# Patient Record
Sex: Male | Born: 1960 | Race: White | Hispanic: No | Smoking: Current every day smoker
Health system: Southern US, Community
[De-identification: ages and names within clinical notes are randomized; demographics above are authoritative.]

## PROBLEM LIST (undated history)

## (undated) DIAGNOSIS — F419 Anxiety disorder, unspecified: Secondary | ICD-10-CM

## (undated) DIAGNOSIS — G8929 Other chronic pain: Secondary | ICD-10-CM

## (undated) DIAGNOSIS — Z8781 Personal history of (healed) traumatic fracture: Secondary | ICD-10-CM

## (undated) DIAGNOSIS — E785 Hyperlipidemia, unspecified: Secondary | ICD-10-CM

## (undated) DIAGNOSIS — M1990 Unspecified osteoarthritis, unspecified site: Secondary | ICD-10-CM

## (undated) DIAGNOSIS — I10 Essential (primary) hypertension: Secondary | ICD-10-CM

## (undated) DIAGNOSIS — M549 Dorsalgia, unspecified: Secondary | ICD-10-CM

## (undated) HISTORY — DX: Dorsalgia, unspecified: M54.9

## (undated) HISTORY — DX: Unspecified osteoarthritis, unspecified site: M19.90

## (undated) HISTORY — PX: TONSILLECTOMY: SUR1361

## (undated) HISTORY — DX: Personal history of (healed) traumatic fracture: Z87.81

## (undated) HISTORY — DX: Hyperlipidemia, unspecified: E78.5

## (undated) HISTORY — DX: Anxiety disorder, unspecified: F41.9

## (undated) HISTORY — DX: Essential (primary) hypertension: I10

## (undated) HISTORY — DX: Other chronic pain: G89.29

---

## 2009-10-12 DIAGNOSIS — G8929 Other chronic pain: Secondary | ICD-10-CM

## 2009-10-12 HISTORY — DX: Other chronic pain: G89.29

## 2012-04-20 ENCOUNTER — Telehealth: Payer: Self-pay

## 2012-04-20 NOTE — Telephone Encounter (Signed)
LMOM to call.

## 2012-04-22 NOTE — Telephone Encounter (Signed)
LMOM to call.

## 2012-04-22 NOTE — Telephone Encounter (Signed)
Called, busy

## 2012-04-26 NOTE — Telephone Encounter (Signed)
Letter sent to pt and to pcp.

## 2016-05-12 DIAGNOSIS — Z139 Encounter for screening, unspecified: Secondary | ICD-10-CM

## 2016-05-14 ENCOUNTER — Ambulatory Visit: Payer: Self-pay | Admitting: Physician Assistant

## 2016-05-14 ENCOUNTER — Other Ambulatory Visit: Payer: Self-pay | Admitting: Physician Assistant

## 2016-05-14 ENCOUNTER — Encounter: Payer: Self-pay | Admitting: Physician Assistant

## 2016-05-14 VITALS — BP 140/82 | HR 104 | Temp 98.4°F | Ht 69.0 in | Wt 296.4 lb

## 2016-05-14 DIAGNOSIS — Z1211 Encounter for screening for malignant neoplasm of colon: Secondary | ICD-10-CM

## 2016-05-14 DIAGNOSIS — F17210 Nicotine dependence, cigarettes, uncomplicated: Secondary | ICD-10-CM

## 2016-05-14 DIAGNOSIS — R000 Tachycardia, unspecified: Secondary | ICD-10-CM

## 2016-05-14 DIAGNOSIS — I10 Essential (primary) hypertension: Secondary | ICD-10-CM

## 2016-05-14 DIAGNOSIS — F419 Anxiety disorder, unspecified: Secondary | ICD-10-CM

## 2016-05-14 DIAGNOSIS — G8929 Other chronic pain: Secondary | ICD-10-CM

## 2016-05-14 DIAGNOSIS — R9431 Abnormal electrocardiogram [ECG] [EKG]: Secondary | ICD-10-CM

## 2016-05-14 DIAGNOSIS — Z131 Encounter for screening for diabetes mellitus: Secondary | ICD-10-CM

## 2016-05-14 DIAGNOSIS — R609 Edema, unspecified: Secondary | ICD-10-CM

## 2016-05-14 DIAGNOSIS — E785 Hyperlipidemia, unspecified: Secondary | ICD-10-CM

## 2016-05-14 DIAGNOSIS — M549 Dorsalgia, unspecified: Secondary | ICD-10-CM

## 2016-05-14 DIAGNOSIS — Z125 Encounter for screening for malignant neoplasm of prostate: Secondary | ICD-10-CM

## 2016-05-14 LAB — GLUCOSE, POCT (MANUAL RESULT ENTRY): POC GLUCOSE: 98 mg/dL (ref 70–99)

## 2016-05-14 NOTE — Patient Instructions (Signed)
Get fasting labs/bloodwork done Turn in [facility] discount application Return stool test/poop test to office Contact [facility] for anxiety

## 2016-05-14 NOTE — Progress Notes (Signed)
BP (!) 158/88 (BP Location: Left Arm, Patient Position: Sitting, Cuff Size: Large)   Pulse (!) 104   Temp 98.4 F (36.9 C) (Other (Comment))   Ht 5\' 9"  (1.753 m)   Wt 296 lb 6.4 oz (134.4 kg)   SpO2 96%   BMI 43.77 kg/m    Subjective:    Patient ID: Jerome Kelly, male    DOB: Feb 09, 1961, 55 y.o.   MRN: [id number]  HPI: Jerome Kelly is a 55 y.o. male presenting on 05/14/2016 for New Patient (Initial Visit)   HPI  Pt was previously treated at [facility].  He lost his insurance in June.  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   Pt is expecting to be able to get back to work in another month or two.    Pt takes all of his meds at night (except the xanax).  He says he thinks his bp is high today because he is nervous.  He says his bp is usually controlled.   Pt says he hasn't have labs drawn in about a year or so over at [facility].  He says he has been taking tramadol for 6 years for chronic back pain.  He usually takes it once or twice daily.    Pt says he never got a colonoscopy (attempts made to schedule noted in EPIC) because he didn't have the copay.    Pt states LE edema for few weeks  Relevant past medical, surgical, family and social history reviewed and updated as indicated. Interim medical history since our last visit reviewed. Allergies and medications reviewed and updated.   Current Outpatient Prescriptions:  .  ALPRAZolam (XANAX) 1 MG tablet, Take 1 mg by mouth every 6 (six) hours as needed for anxiety., Disp: , Rfl:  .  amLODipine (NORVASC) 10 MG tablet, Take 10 mg by mouth daily., Disp: , Rfl:  .  aspirin 81 MG tablet, Take 81 mg by mouth daily., Disp: , Rfl:  .  atorvastatin (LIPITOR) 10 MG tablet, Take 10 mg by mouth daily., Disp: , Rfl:  .  lisinopril (PRINIVIL,ZESTRIL) 40 MG tablet, Take 40 mg by mouth daily., Disp: , Rfl:  .  traMADol (ULTRAM) 50 MG tablet, Take 50 mg by mouth every 6 (six) hours as  needed., Disp: , Rfl:    Review of Systems  Constitutional: Negative for appetite change, chills, diaphoresis, fatigue, fever and unexpected weight change.  HENT: Negative for congestion, drooling, ear pain, facial swelling, hearing loss, mouth sores, sneezing, sore throat, trouble swallowing and voice change.   Eyes: Negative for pain, discharge, redness, itching and visual disturbance.  Respiratory: Negative for cough, choking, shortness of breath and wheezing.   Cardiovascular: Negative for chest pain, palpitations and leg swelling.  Gastrointestinal: Negative for abdominal pain, blood in stool, constipation, diarrhea and vomiting.  Endocrine: Negative for cold intolerance, heat intolerance and polydipsia.  Genitourinary: Negative for decreased urine volume, dysuria and hematuria.  Musculoskeletal: Positive for back pain. Negative for arthralgias and gait problem.  Skin: Negative for rash.  Allergic/Immunologic: Negative for environmental allergies.  Neurological: Negative for seizures, syncope, light-headedness and headaches.  Hematological: Negative for adenopathy.  Psychiatric/Behavioral: Negative for agitation, dysphoric mood and suicidal ideas. The patient is nervous/anxious.     Per HPI unless specifically indicated above     Objective:    BP (!) 158/88 (BP Location: Left Arm, Patient Position: Sitting, Cuff Size: Large)  Pulse (!) 104   Temp 98.4 F (36.9 C) (Other (Comment))   Ht  (1.753 m)   Wt 296 lb 6.4 oz (134.4 kg)   SpO2 96%   BMI 43.77 kg/m   Wt Readings from Last 3 Encounters:  05/14/16 296 lb 6.4 oz (134.4 kg)    BP recheck 140/82  Physical Exam  Constitutional: He is oriented to person, place, and time. He appears well-developed and well-nourished.  HENT:  Head: Normocephalic and atraumatic.  Mouth/Throat: Oropharynx is clear and moist. No oropharyngeal exudate.  Eyes: Conjunctivae and EOM are normal. Pupils are equal, round, and reactive to  light.  Neck: Neck supple. No thyromegaly present.  Cardiovascular: Regular rhythm and intact distal pulses.  Tachycardia present.   Pulmonary/Chest: Effort normal. He has wheezes (occ soft exp). He has no rhonchi. He has no rales.  Abdominal: Soft. Bowel sounds are normal. He exhibits no mass. There is no hepatosplenomegaly. There is no tenderness.  Musculoskeletal: He exhibits edema (1+ BLE ).  Lymphadenopathy:    He has no cervical adenopathy.  Neurological: He is alert and oriented to person, place, and time.  Skin: Skin is warm and dry. Rash noted.  Skin of lower abdomen irritated with scattered pustules. No abscess seen  Psychiatric: His mood appears anxious. His speech is tangential. He is agitated.  Vitals reviewed.  EKG- sinus rhythm at 77 bpm with supraventricular extrasystoles, q waves inferior leads. No previous for comparison  Results for orders placed or performed in visit on 05/14/16  POCT Glucose (CBG)  Result Value Ref Range   POC Glucose 98 70 - 99 mg/dl      Assessment & Plan:   Encounter Diagnoses  Name Primary?  . Essential hypertension, benign Yes  . Anxiety   . Tachycardia   . Edema, unspecified type   . Morbid obesity, unspecified obesity type (HCC)   . Cigarette nicotine dependence without complication   . Hyperlipidemia   . Chronic back pain   . Nonspecific abnormal electrocardiogram (ECG) (EKG)   . Screening for diabetes mellitus   . Screening for prostate cancer   . Special screening for malignant neoplasms, colon     -get baseline fasting labs tomorrow morning -gave pt contact information to [facility] for anxiety and suspect some condition more than simple anxiety disorder.  Urged him to call for appointment or go to walk-in -gave iFOBT for colon cancer screening -discussed [facility] not chronic pain clinic and cannot prescribe controlled substance for this -gave [facility] discount application then get echo -will get pt signed up for medassist.  He is  notified that it will take 2-3 weeks for his application to be completed before his first medication arrives in the mail.  He has refills on his medications and is encouraged to continue them -counseled on smoking cessation -f/u 1 month. rto sooner prn

## 2016-06-16 ENCOUNTER — Ambulatory Visit: Payer: Self-pay | Admitting: Physician Assistant

## 2016-06-17 NOTE — Congregational Nurse Program (Signed)
Congregational Nurse Program Note  Date of Encounter: 05/12/2016  Past Medical History: Past Medical History:  Diagnosis Date  . Anxiety   . Chronic back pain 2011  . Hyperlipidemia   . Hypertension     Encounter Details:   [facility]. Client [facility] with [facility], but due to loss of work and medical insurance is unable to continue there paying out of pocket.  Past medical history reported by client: hypertension, increased cholesterol, anxiety "nerves".  Medications per client: Alprazolam 1 mg every 6 hrs as needed. Tramadol 50 mg 4 times daily for pain Atorvastatin 100 mg daily Lisinopril 40 mg daily Amlodipine 10 mg daily Past surgery: tonsillectomy 1996 No Known Drug allergies Currently client is unemployed and lives with a friend. Client states he last was employed with [employer] in May, but failed a urine drug screen. Client states he does not take any illegal drugs . [place] [facility] [place] and appointment made to become PCP. Referral to the [employer] to apply for food assistance. Ref[facility] to [facility] for resume' building and job listings Discussed with client regarding current loan that is with the [employer]. Client states he has never missed a loan payment, but will not be able to pay now. Encouraged client to make an appointment with the loan officer there and discuss his situation to determine if there is any type of assistance or grace period they could offer him. Client states that he will do so. Will follow up with client as needed.

## 2016-06-25 ENCOUNTER — Encounter: Payer: Self-pay | Admitting: Physician Assistant

## 2017-08-12 ENCOUNTER — Other Ambulatory Visit (HOSPITAL_COMMUNITY): Payer: Self-pay | Admitting: Internal Medicine

## 2017-08-12 ENCOUNTER — Ambulatory Visit (HOSPITAL_COMMUNITY)
Admission: RE | Admit: 2017-08-12 | Discharge: 2017-08-12 | Disposition: A | Discharge status: Home / Self Care | Payer: BC Managed Care – PPO | Source: Ambulatory Visit | Attending: Internal Medicine | Admitting: Internal Medicine

## 2017-08-12 DIAGNOSIS — R05 Cough: Secondary | ICD-10-CM

## 2017-08-12 DIAGNOSIS — R918 Other nonspecific abnormal finding of lung field: Secondary | ICD-10-CM | POA: Diagnosis not present

## 2017-08-12 DIAGNOSIS — J13 Pneumonia due to Streptococcus pneumoniae: Secondary | ICD-10-CM

## 2017-08-12 DIAGNOSIS — R059 Cough, unspecified: Secondary | ICD-10-CM

## 2018-09-21 ENCOUNTER — Encounter: Payer: Self-pay | Admitting: Orthopedic Surgery

## 2018-10-10 ENCOUNTER — Other Ambulatory Visit (HOSPITAL_COMMUNITY): Payer: Self-pay | Admitting: Internal Medicine

## 2018-10-10 ENCOUNTER — Ambulatory Visit (HOSPITAL_COMMUNITY)
Admission: RE | Admit: 2018-10-10 | Discharge: 2018-10-10 | Disposition: A | Discharge status: Home / Self Care | Payer: BC Managed Care – PPO | Source: Ambulatory Visit | Attending: Internal Medicine | Admitting: Internal Medicine

## 2018-10-10 DIAGNOSIS — R0600 Dyspnea, unspecified: Secondary | ICD-10-CM | POA: Diagnosis present

## 2018-10-24 ENCOUNTER — Ambulatory Visit: Payer: Self-pay | Admitting: Orthopedic Surgery

## 2018-11-23 ENCOUNTER — Ambulatory Visit: Payer: BC Managed Care – PPO | Admitting: Cardiology

## 2018-11-25 ENCOUNTER — Ambulatory Visit: Payer: BC Managed Care – PPO | Admitting: Cardiology

## 2018-12-20 ENCOUNTER — Ambulatory Visit: Payer: BC Managed Care – PPO | Admitting: Cardiology

## 2019-01-10 ENCOUNTER — Telehealth: Payer: Self-pay | Admitting: *Deleted

## 2019-01-10 NOTE — Telephone Encounter (Signed)
Pt reminded of 4/2 appt with Dr Diona Browner via telephone - pt medication reviewed/allergies/pharmacy - pt doesn't have home BP monitor but will check to see if he can obtain one prior to appt - does have a scale at home - no recent hospitalizations Chart reviewed with patient.  Patient reminded to have weight and vitals ready before the phone call begins.    The patient [facility] that [facility]her insurance company will be billedfor the encounter.

## 2019-01-11 ENCOUNTER — Encounter: Payer: Self-pay | Admitting: Cardiology

## 2019-01-11 NOTE — Progress Notes (Deleted)
Virtual Visit via Telephone Note    Evaluation Performed:  New patient visit  This visit type was conducted due to national recommendations for restrictions regarding the COVID-19 Pandemic (e.g. social distancing).  This format is felt to be most appropriate for this patient at this time.  All issues noted in this document were discussed and addressed.  No physical exam was performed (except for noted visual exam findings with Video Visits).  The patient verbally consented to a telehealth phone visit today with [facility] and understands that their insurance company will be billed for the encounter.  Date:  01/11/2019   ID:  Jerome Kelly, DOB 12/06/1960, MRN [id number]  Patient Location:  [address] [place], [place] [address]  Provider location:   [facility] [address] [place], [place], [place] [address] Phone: [phone]; Fax: [phone]  PCP:  Elfredia Nevins, MD  Consulting Cardiologist:  Nona Dell, MD   Chief Complaint: Shortness of breath  History of Present Illness:    Jerome Kelly is a 58 y.o. male who presents via audio/video conferencing for a telehealth visit today.  He was initially referred for cardiology consultation by Dr. Sherwood Gambler back in December 2019 for the evaluation of shortness of breath.  I reviewed the available records and updated the chart.  The patient {does/does not:200015} have symptoms concerning for COVID-19 infection (fever, chills, cough, or new shortness of breath).    Prior CV studies:   The following studies were reviewed today:  ECG 05/14/2016: I personally reviewed the tracing which shows sinus rhythm with PACs, IVCD, rule out old inferior infarct pattern.  Chest x-ray 10/10/2018: FINDINGS: Cardiomegaly with bilateral pulmonary interstitial prominence suggesting CHF. Left base subsegmental atelectasis and or scarring again noted. Left base pleural-parenchymal thickening again noted consistent scarring. No  pneumothorax.  IMPRESSION: 1. Cardiomegaly with bilateral pulmonary interstitial prominence suggesting CHF.  2. Left base subsegmental atelectasis and or scarring. Left base pleural-parenchymal scarring. Similar findings noted on prior exam.  Past Medical History:  Diagnosis Date  . Anxiety   . Chronic back pain 2011  . History of fracture of clavicle    Right  . Hyperlipidemia   . Hypertension   . Osteoarthritis    Past Surgical History:  Procedure Laterality Date  . TONSILLECTOMY       No outpatient medications have been marked as taking for the 01/12/19 encounter (Appointment) with Jonelle Sidle, MD.     Allergies:   Patient has no known allergies.   Social History   Tobacco Use  . Smoking status: Current Every Day Smoker    Packs/day: 0.50    Years: 25.00    Pack years: 12.50    Types: Cigarettes  . Smokeless tobacco: Never Used  Substance Use Topics  . Alcohol use: Yes    Comment: Occasional  . Drug use: No     Family Hx: The patient's family history includes Diabetes in his father; Ovarian cancer in his mother.  ROS:   Please see the history of present illness.    *** All other systems reviewed and are negative.   Labs/Other Tests and Data Reviewed:    Recent Labs:  December 2019: Cholesterol 179, LDL 97 June 2019: Hemoglobin 15.8  Wt Readings from Last 3 Encounters:  05/14/16 296 lb 6.4 oz (134.4 kg)     Objective:    Vital Signs:  There were no vitals taken for this visit.   Well nourished, well developed male  in no*** acute distress. ***  ASSESSMENT & PLAN:    1.  ***  COVID-19 Education: The signs and symptoms of COVID-19 were discussed with the patient and how to seek care for testing (follow up with PCP or arrange E-visit).  ***The importance of social distancing was discussed today.  Patient Risk:   After full review of this patient's clinical status, I feel that they are at least moderate risk at this time.  Time:    Today, I have spent *** minutes with the patient with telehealth technology discussing ***.     Medication Adjustments/Labs and Tests Ordered: Current medicines are reviewed at length with the patient today.  Concerns regarding medicines are outlined above.  Tests Ordered: No orders of the defined types were placed in this encounter.  Medication Changes: No orders of the defined types were placed in this encounter.   Disposition:  Follow up {follow up:15908}  Signed, Nona Dell, MD  01/11/2019 11:54 AM     [facility]

## 2019-01-12 ENCOUNTER — Telehealth: Payer: BC Managed Care – PPO | Admitting: Cardiology

## 2019-01-12 ENCOUNTER — Encounter: Payer: Self-pay | Admitting: Cardiology

## 2019-03-31 ENCOUNTER — Encounter (HOSPITAL_COMMUNITY): Payer: Self-pay | Admitting: *Deleted

## 2019-03-31 ENCOUNTER — Emergency Department (HOSPITAL_COMMUNITY): Payer: BC Managed Care – PPO

## 2019-03-31 ENCOUNTER — Inpatient Hospital Stay (HOSPITAL_COMMUNITY)
Admission: EM | Admit: 2019-03-31 | Discharge: 2019-04-01 | DRG: 193 | Discharge status: Against Medical Advice | Payer: BC Managed Care – PPO | Attending: Internal Medicine | Admitting: Internal Medicine

## 2019-03-31 ENCOUNTER — Other Ambulatory Visit: Payer: Self-pay

## 2019-03-31 DIAGNOSIS — R109 Unspecified abdominal pain: Secondary | ICD-10-CM | POA: Diagnosis present

## 2019-03-31 DIAGNOSIS — Z8041 Family history of malignant neoplasm of ovary: Secondary | ICD-10-CM | POA: Diagnosis not present

## 2019-03-31 DIAGNOSIS — G8929 Other chronic pain: Secondary | ICD-10-CM | POA: Diagnosis present

## 2019-03-31 DIAGNOSIS — G4710 Hypersomnia, unspecified: Secondary | ICD-10-CM | POA: Diagnosis present

## 2019-03-31 DIAGNOSIS — R7989 Other specified abnormal findings of blood chemistry: Secondary | ICD-10-CM | POA: Diagnosis not present

## 2019-03-31 DIAGNOSIS — I110 Hypertensive heart disease with heart failure: Secondary | ICD-10-CM | POA: Diagnosis present

## 2019-03-31 DIAGNOSIS — Z20828 Contact with and (suspected) exposure to other viral communicable diseases: Secondary | ICD-10-CM | POA: Diagnosis present

## 2019-03-31 DIAGNOSIS — J440 Chronic obstructive pulmonary disease with acute lower respiratory infection: Secondary | ICD-10-CM | POA: Diagnosis present

## 2019-03-31 DIAGNOSIS — J9602 Acute respiratory failure with hypercapnia: Secondary | ICD-10-CM | POA: Diagnosis present

## 2019-03-31 DIAGNOSIS — F17210 Nicotine dependence, cigarettes, uncomplicated: Secondary | ICD-10-CM | POA: Diagnosis present

## 2019-03-31 DIAGNOSIS — I10 Essential (primary) hypertension: Secondary | ICD-10-CM | POA: Diagnosis not present

## 2019-03-31 DIAGNOSIS — X500XXA Overexertion from strenuous movement or load, initial encounter: Secondary | ICD-10-CM | POA: Diagnosis not present

## 2019-03-31 DIAGNOSIS — Z7982 Long term (current) use of aspirin: Secondary | ICD-10-CM | POA: Diagnosis not present

## 2019-03-31 DIAGNOSIS — Z5329 Procedure and treatment not carried out because of patient's decision for other reasons: Secondary | ICD-10-CM | POA: Diagnosis not present

## 2019-03-31 DIAGNOSIS — Z79891 Long term (current) use of opiate analgesic: Secondary | ICD-10-CM | POA: Diagnosis not present

## 2019-03-31 DIAGNOSIS — I509 Heart failure, unspecified: Secondary | ICD-10-CM

## 2019-03-31 DIAGNOSIS — J9622 Acute and chronic respiratory failure with hypercapnia: Secondary | ICD-10-CM | POA: Diagnosis present

## 2019-03-31 DIAGNOSIS — R188 Other ascites: Secondary | ICD-10-CM | POA: Diagnosis present

## 2019-03-31 DIAGNOSIS — Z833 Family history of diabetes mellitus: Secondary | ICD-10-CM | POA: Diagnosis not present

## 2019-03-31 DIAGNOSIS — Z79899 Other long term (current) drug therapy: Secondary | ICD-10-CM

## 2019-03-31 DIAGNOSIS — J181 Lobar pneumonia, unspecified organism: Secondary | ICD-10-CM | POA: Diagnosis not present

## 2019-03-31 DIAGNOSIS — Z6841 Body Mass Index (BMI) 40.0 and over, adult: Secondary | ICD-10-CM

## 2019-03-31 DIAGNOSIS — F419 Anxiety disorder, unspecified: Secondary | ICD-10-CM | POA: Insufficient documentation

## 2019-03-31 DIAGNOSIS — E785 Hyperlipidemia, unspecified: Secondary | ICD-10-CM | POA: Diagnosis present

## 2019-03-31 DIAGNOSIS — J9601 Acute respiratory failure with hypoxia: Secondary | ICD-10-CM | POA: Diagnosis present

## 2019-03-31 DIAGNOSIS — J9621 Acute and chronic respiratory failure with hypoxia: Secondary | ICD-10-CM | POA: Diagnosis present

## 2019-03-31 DIAGNOSIS — R0600 Dyspnea, unspecified: Secondary | ICD-10-CM

## 2019-03-31 DIAGNOSIS — J189 Pneumonia, unspecified organism: Principal | ICD-10-CM | POA: Diagnosis present

## 2019-03-31 LAB — URINALYSIS, ROUTINE W REFLEX MICROSCOPIC
Bacteria, UA: NONE SEEN
Bilirubin Urine: NEGATIVE
Glucose, UA: NEGATIVE mg/dL
Ketones, ur: NEGATIVE mg/dL
Leukocytes,Ua: NEGATIVE
Nitrite: NEGATIVE
Protein, ur: 30 mg/dL — AB
RBC / HPF: >50 RBC/hpf — ABNORMAL HIGH (ref 0–5)
Specific Gravity, Urine: 1.025 (ref 1.005–1.030)
pH: 5 (ref 5.0–8.0)

## 2019-03-31 LAB — BRAIN NATRIURETIC PEPTIDE: B Natriuretic Peptide: 141 pg/mL — ABNORMAL HIGH (ref 0.0–100.0)

## 2019-03-31 LAB — BLOOD GAS, ARTERIAL
Acid-Base Excess: 6.4 mmol/L — ABNORMAL HIGH (ref 0.0–2.0)
Acid-Base Excess: 6.3 mmol/L — ABNORMAL HIGH (ref 0.0–2.0)
Acid-Base Excess: 6.3 mmol/L — ABNORMAL HIGH (ref 0.0–2.0)
Bicarbonate: 27.3 mmol/L (ref 20.0–28.0)
Bicarbonate: 26.3 mmol/L (ref 20.0–28.0)
Bicarbonate: 26.8 mmol/L (ref 20.0–28.0)
FIO2: 28
FIO2: 60
FIO2: 40
O2 Saturation: 93.4 %
O2 Saturation: 96.6 %
O2 Saturation: 95.5 %
Patient temperature: 36.6
Patient temperature: 36.6
Patient temperature: 37
pCO2 arterial: 78.5 mmHg (ref 32.0–48.0)
pCO2 arterial: 95.8 mmHg (ref 32.0–48.0)
pCO2 arterial: 85.3 mmHg (ref 32.0–48.0)
pH, Arterial: 7.25 — ABNORMAL LOW (ref 7.350–7.450)
pH, Arterial: 7.18 — CL (ref 7.350–7.450)
pH, Arterial: 7.22 — ABNORMAL LOW (ref 7.350–7.450)
pO2, Arterial: 71.4 mmHg — ABNORMAL LOW (ref 83.0–108.0)
pO2, Arterial: 102 mmHg (ref 83.0–108.0)
pO2, Arterial: 88.2 mmHg (ref 83.0–108.0)

## 2019-03-31 LAB — CBC WITH DIFFERENTIAL/PLATELET
Abs Immature Granulocytes: 0.03 10*3/uL (ref 0.00–0.07)
Basophils Absolute: 0.1 10*3/uL (ref 0.0–0.1)
Basophils Relative: 1 %
Eosinophils Absolute: 0.1 10*3/uL (ref 0.0–0.5)
Eosinophils Relative: 1 %
HCT: 49.6 % (ref 39.0–52.0)
Hemoglobin: 14.9 g/dL (ref 13.0–17.0)
Immature Granulocytes: 0 %
Lymphocytes Relative: 20 %
Lymphs Abs: 1.5 10*3/uL (ref 0.7–4.0)
MCH: 34.2 pg — ABNORMAL HIGH (ref 26.0–34.0)
MCHC: 30 g/dL (ref 30.0–36.0)
MCV: 113.8 fL — ABNORMAL HIGH (ref 80.0–100.0)
Monocytes Absolute: 1 10*3/uL (ref 0.1–1.0)
Monocytes Relative: 13 %
Neutro Abs: 4.9 10*3/uL (ref 1.7–7.7)
Neutrophils Relative %: 65 %
Platelets: 207 10*3/uL (ref 150–400)
RBC: 4.36 MIL/uL (ref 4.22–5.81)
RDW: 14.7 % (ref 11.5–15.5)
WBC: 7.5 10*3/uL (ref 4.0–10.5)
nRBC: 0.3 % — ABNORMAL HIGH (ref 0.0–0.2)

## 2019-03-31 LAB — COMPREHENSIVE METABOLIC PANEL
ALT: 28 U/L (ref 0–44)
AST: 20 U/L (ref 15–41)
Albumin: 3.4 g/dL — ABNORMAL LOW (ref 3.5–5.0)
Alkaline Phosphatase: 99 U/L (ref 38–126)
Anion gap: 10 (ref 5–15)
BUN: 41 mg/dL — ABNORMAL HIGH (ref 6–20)
CO2: 31 mmol/L (ref 22–32)
Calcium: 8.3 mg/dL — ABNORMAL LOW (ref 8.9–10.3)
Chloride: 105 mmol/L (ref 98–111)
Creatinine, Ser: 1.47 mg/dL — ABNORMAL HIGH (ref 0.61–1.24)
GFR calc Af Amer: >60 mL/min (ref >60)
GFR calc non Af Amer: 52 mL/min — ABNORMAL LOW (ref >60)
Glucose, Bld: 159 mg/dL — ABNORMAL HIGH (ref 70–99)
Potassium: 4.7 mmol/L (ref 3.5–5.1)
Sodium: 146 mmol/L — ABNORMAL HIGH (ref 135–145)
Total Bilirubin: 0.6 mg/dL (ref 0.3–1.2)
Total Protein: 6.6 g/dL (ref 6.5–8.1)

## 2019-03-31 LAB — SARS CORONAVIRUS 2 BY RT PCR (HOSPITAL ORDER, PERFORMED IN ~~LOC~~ HOSPITAL LAB): SARS Coronavirus 2: NEGATIVE

## 2019-03-31 LAB — PROCALCITONIN: Procalcitonin: <0.1 ng/mL

## 2019-03-31 LAB — LACTIC ACID, PLASMA
Lactic Acid, Venous: 0.9 mmol/L (ref 0.5–1.9)
Lactic Acid, Venous: 1 mmol/L (ref 0.5–1.9)

## 2019-03-31 LAB — LIPASE, BLOOD: Lipase: 28 U/L (ref 11–51)

## 2019-03-31 MED ORDER — ENOXAPARIN SODIUM 100 MG/ML ~~LOC~~ SOLN
90.0000 mg | SUBCUTANEOUS | Status: DC
  Administered 2019-04-01: 90 mg via SUBCUTANEOUS
  Filled 2019-03-31: qty 1

## 2019-03-31 MED ORDER — IPRATROPIUM BROMIDE HFA 17 MCG/ACT IN AERS
2.0000 | INHALATION_SPRAY | Freq: Once | RESPIRATORY_TRACT | Status: AC
  Administered 2019-03-31: 13:00:00 2 via RESPIRATORY_TRACT
  Filled 2019-03-31: qty 12.9

## 2019-03-31 MED ORDER — SODIUM CHLORIDE 0.9 % IV SOLN
2.0000 g | INTRAVENOUS | Status: DC
  Administered 2019-04-01: 2 g via INTRAVENOUS
  Filled 2019-03-31: qty 20

## 2019-03-31 MED ORDER — FUROSEMIDE 10 MG/ML IJ SOLN
40.0000 mg | Freq: Two times a day (BID) | INTRAMUSCULAR | Status: DC
  Administered 2019-04-01 (×2): 40 mg via INTRAVENOUS
  Filled 2019-03-31 (×2): qty 4

## 2019-03-31 MED ORDER — FENTANYL CITRATE (PF) 100 MCG/2ML IJ SOLN
12.5000 ug | Freq: Once | INTRAMUSCULAR | Status: DC
  Filled 2019-03-31: qty 2

## 2019-03-31 MED ORDER — ALPRAZOLAM 0.5 MG PO TABS: 1.0000 mg | ORAL_TABLET | Freq: Four times a day (QID) | ORAL | Status: DC | PRN

## 2019-03-31 MED ORDER — ASPIRIN EC 81 MG PO TBEC
81.0000 mg | DELAYED_RELEASE_TABLET | Freq: Every day | ORAL | Status: DC
  Administered 2019-04-01 (×2): 81 mg via ORAL
  Filled 2019-03-31 (×6): qty 1

## 2019-03-31 MED ORDER — SODIUM CHLORIDE 0.9 % IV SOLN
500.0000 mg | Freq: Once | INTRAVENOUS | Status: AC
  Administered 2019-03-31: 17:00:00 500 mg via INTRAVENOUS
  Filled 2019-03-31: qty 500

## 2019-03-31 MED ORDER — POTASSIUM CHLORIDE CRYS ER 20 MEQ PO TBCR
20.0000 meq | EXTENDED_RELEASE_TABLET | Freq: Two times a day (BID) | ORAL | Status: DC
  Administered 2019-04-01 (×2): 20 meq via ORAL
  Filled 2019-03-31 (×2): qty 1

## 2019-03-31 MED ORDER — TRAMADOL HCL 50 MG PO TABS: 50.0000 mg | ORAL_TABLET | Freq: Four times a day (QID) | ORAL | Status: DC | PRN

## 2019-03-31 MED ORDER — ATORVASTATIN CALCIUM 10 MG PO TABS
10.0000 mg | ORAL_TABLET | Freq: Every day | ORAL | Status: DC
  Administered 2019-04-01 (×2): 10 mg via ORAL
  Filled 2019-03-31 (×2): qty 1

## 2019-03-31 MED ORDER — AMLODIPINE BESYLATE 5 MG PO TABS
10.0000 mg | ORAL_TABLET | Freq: Every day | ORAL | Status: DC
  Administered 2019-04-01 (×2): 10 mg via ORAL
  Filled 2019-03-31 (×2): qty 2

## 2019-03-31 MED ORDER — HYDROGEN PEROXIDE 3 % EX SOLN
Freq: Every day | CUTANEOUS | Status: DC
  Filled 2019-03-31: qty 473

## 2019-03-31 MED ORDER — SODIUM CHLORIDE 0.9 % IV SOLN
500.0000 mg | INTRAVENOUS | Status: DC
  Administered 2019-04-01: 500 mg via INTRAVENOUS
  Filled 2019-03-31: qty 500

## 2019-03-31 MED ORDER — ALBUTEROL SULFATE HFA 108 (90 BASE) MCG/ACT IN AERS
6.0000 | INHALATION_SPRAY | Freq: Once | RESPIRATORY_TRACT | Status: AC
  Administered 2019-03-31: 6 via RESPIRATORY_TRACT
  Filled 2019-03-31: qty 6.7

## 2019-03-31 MED ORDER — IOHEXOL 350 MG/ML SOLN
100.0000 mL | Freq: Once | INTRAVENOUS | Status: AC | PRN
  Administered 2019-03-31: 100 mL via INTRAVENOUS

## 2019-03-31 MED ORDER — SODIUM CHLORIDE 0.9 % IV SOLN
1.0000 g | Freq: Once | INTRAVENOUS | Status: AC
  Administered 2019-03-31: 1 g via INTRAVENOUS
  Filled 2019-03-31: qty 10

## 2019-03-31 NOTE — ED Notes (Addendum)
Pt c/o lower abdominal pain that radiates to scrotum x 2 weeks. States pain is worse today. Pt states he last urinated this am with no pain. Pt placed on monitor-O2 sats 82%. Pt sat up straight in bed and placed on O2 [place] @2L .

## 2019-03-31 NOTE — ED Provider Notes (Signed)
Medical screening examination/treatment/procedure(s) were conducted as a shared visit with non-physician practitioner(s) and myself.  I personally evaluated the patient during the encounter.  EKG Interpretation  Date/Time:  Friday March 31 2019 12:25:29 EDT Ventricular Rate:  126 PR Interval:    QRS Duration: 155 QT Interval:  365 QTC Calculation: 529 R Axis:   137 Text Interpretation:  Junctional tachycardia RBBB and LPFB Probable inferior infarct, age indeterminate Lateral leads are also involved Baseline wander in lead(s) V5 No prior for comparison. No STEMI  Confirmed by Nanda Quinton [phone]) on 03/31/2019 12:56:44 PM   CRITICAL CARE Performed by: Fredia Sorrow Total critical care time: 30 minutes Critical care time was exclusive of separately billable procedures and treating other patients. Critical care was necessary to treat or prevent imminent or life-threatening deterioration. Critical care was time spent personally by me on the following activities: development of treatment plan with patient and/or surrogate as well as nursing, discussions with consultants, evaluation of patient's response to treatment, examination of patient, obtaining history from patient or surrogate, ordering and performing treatments and interventions, ordering and review of laboratory studies, ordering and review of radiographic studies, pulse oximetry and re-evaluation of patient's condition.   Patient seen by me along with physician assistant.  Patient presenting without respiratory distress.  COVID-19 testing was negative so patient was treated with BiPAP.  Patient does have a history of COPD.  Clinically probably also has a history of sleep apnea.  Initial blood gas was concerning with a very high PCO2 prior to going on the BiPAP.  Following treatment with BiPAP patient was still quite somnolent and repeat blood gas showed a significant acidosis and a market elevation in PCO2 in the 90 range.  Based on this  plan was moved patient in room to preparation for information.  In the process of starting another IV and preparing for this patient became quite alert.  Refused intubation.  We had made some recent changes in his BiPAP settings to ventilate him better.  This may have made a difference.  Chest x-ray raises some concerns for pneumonia we will start him on broad-spectrum antibiotics and also check lactic acid.  There could be a component of sepsis.  Patient does not at this time require the full fluid 30 cc/kg challenge.  Repeat blood gas will be done in about 30 minutes.  And I will reassess.  Patient clearly will require admission.  Feel that this could be possibly an underlying pneumonia also very well could be septic picture.  Also could be severe exacerbation of COPD or combination thereof.   Fredia Sorrow, MD 03/31/19 [phone]

## 2019-03-31 NOTE — ED Notes (Signed)
Attempted to give inhaler to pt. Pt lethargic. RT called and EDPA notified.

## 2019-03-31 NOTE — H&P (Signed)
History and Physical  Jerome Kelly ZOX:[id number] DOB: 1960-11-01 DOA: 03/31/2019  Referring physician: Loraine GripSophi Caccavale, ED physician PCP: Elfredia NevinsFusco, Lawrence, MD  Outpatient Specialists: none  Patient Coming From: home  Chief Complaint: Abdominal pain, shortness of breath  HPI: Jerome Kelly is a 58 y.o. male with a history of obesity, chronic pain, hyperlipidemia, hypertension.  Patient presents with abdominal pain x2 weeks in the right lower quadrant.  Began after lifting lawnmower into a truck.  Gradually worsening.  Pain worse with palpation and movement, but is able to eat and drink without any problems.  No fevers, chills, nausea, vomiting.  In addition, the patient began to be severely hypoxic and short of breath this evening on arrival in the emergency department, his oxygen saturation was less than 85% on room air.  He was placed on BiPAP due to hypersomnolence with gradual improvement.  ABGs were done showing severe hypercarbia.  Emergency Department Course: CTA of the chest shows consolidation in the right lower and right mid lung fields.  CT of the abdomen showed ascites without any acute process.  White count 7 coronavirus test negative.  Creatinine is slightly elevated at 1.47.  No prior value available.  Review of Systems:   Pt denies any fevers, chills, nausea, vomiting, diarrhea, constipation, palpitations, headache, vision changes, lightheadedness, dizziness, melena, rectal bleeding.  Review of systems are otherwise negative  Past Medical History:  Diagnosis Date  . Anxiety   . Chronic back pain 2011  . History of fracture of clavicle    Right  . Hyperlipidemia   . Hypertension   . Osteoarthritis    Past Surgical History:  Procedure Laterality Date  . TONSILLECTOMY     Social History:  reports that he has been smoking cigarettes. He has a 12.50 pack-year smoking history. He has never used smokeless tobacco. He reports current alcohol use. He reports that he does not  use drugs. Patient lives at home  No Known Allergies  Family History  Problem Relation Age of Onset  . Ovarian cancer Mother   . Diabetes Father       Prior to Admission medications   Medication Sig Start Date End Date Taking? Authorizing Provider  ALPRAZolam Prudy Feeler(XANAX) 1 MG tablet Take 1 mg by mouth every 6 (six) hours as needed for anxiety.    [provider]  amLODipine (NORVASC) 10 MG tablet Take 10 mg by mouth daily.    [provider]  aspirin 81 MG tablet Take 81 mg by mouth daily.    [provider]  atorvastatin (LIPITOR) 10 MG tablet Take 10 mg by mouth daily.    [provider]  lisinopril (PRINIVIL,ZESTRIL) 40 MG tablet Take 40 mg by mouth daily.    [provider]  traMADol (ULTRAM) 50 MG tablet Take 50 mg by mouth every 6 (six) hours as needed.    [provider]    Physical Exam: BP 112/78   Pulse (!) 126   Temp 97.9 F (36.6 C)   Resp (!) 21   Ht 5\' 10"  (1.778 m)   Wt (!) 179 kg   SpO2 95%   BMI 56.62 kg/m   . General: Middle-age Caucasian male. Awake and alert and oriented x3. No acute cardiopulmonary distress.  Marland Kitchen. HEENT: Normocephalic atraumatic.  Right and left ears normal in appearance.  Pupils equal, round, reactive to light. Extraocular muscles are intact. Sclerae anicteric and noninjected.  Moist mucosal membranes. No mucosal lesions.  . Neck: Neck supple without lymphadenopathy.  No carotid bruits. No masses palpated.  . Cardiovascular: Tachycardic rate with normal S1-S2 sounds. No murmurs, rubs, gallops auscultated. No JVD.  Marland Kitchen. Respiratory: Rales in right base.  No wheezing or rhonchi..  No accessory muscle use. . Abdomen: Soft, nontender, nondistended. Active bowel sounds. No masses or hepatosplenomegaly  . Skin: No rashes, lesions, or ulcerations.  Dry, warm to touch. 2+ dorsalis pedis and radial pulses. . Musculoskeletal: No calf or leg pain. All major joints not erythematous nontender.  No upper or  lower joint deformation.  Good ROM.  No contractures  . Psychiatric: Intact judgment and insight. Pleasant and cooperative. . Neurologic: No focal neurological deficits. Strength is 5/5 and symmetric in upper and lower extremities.  Cranial nerves II through XII are grossly intact.           Labs on Admission: I have personally reviewed following labs and imaging studies  CBC: Recent Labs  Lab 03/31/19 1250  WBC 7.5  NEUTROABS 4.9  HGB 14.9  HCT 49.6  MCV 113.8*  PLT 207   Basic Metabolic Panel: Recent Labs  Lab 03/31/19 1250  NA 146*  K 4.7  CL 105  CO2 31  GLUCOSE 159*  BUN 41*  CREATININE 1.47*  CALCIUM 8.3*   GFR: Estimated Creatinine Clearance: 90.5 mL/min (A) (by C-G formula based on SCr of 1.47 mg/dL (H)). Liver Function Tests: Recent Labs  Lab 03/31/19 1250  AST 20  ALT 28  ALKPHOS 99  BILITOT 0.6  PROT 6.6  ALBUMIN 3.4*   Recent Labs  Lab 03/31/19 1250  LIPASE 28   No results for input(s): AMMONIA in the last 168 hours. Coagulation Profile: No results for input(s): INR, PROTIME in the last 168 hours. Cardiac Enzymes: No results for input(s): CKTOTAL, CKMB, CKMBINDEX, TROPONINI in the last 168 hours. BNP (last 3 results) No results for input(s): PROBNP in the last 8760 hours. HbA1C: No results for input(s): HGBA1C in the last 72 hours. CBG: No results for input(s): GLUCAP in the last 168 hours. Lipid Profile: No results for input(s): CHOL, HDL, LDLCALC, TRIG, CHOLHDL, LDLDIRECT in the last 72 hours. Thyroid Function Tests: No results for input(s): TSH, T4TOTAL, FREET4, T3FREE, THYROIDAB in the last 72 hours. Anemia Panel: No results for input(s): VITAMINB12, FOLATE, FERRITIN, TIBC, IRON, RETICCTPCT in the last 72 hours. Urine analysis:    Component Value Date/Time   COLORURINE YELLOW 03/31/2019 1630   APPEARANCEUR CLEAR 03/31/2019 1630   LABSPEC 1.025 03/31/2019 1630   PHURINE 5.0 03/31/2019 1630   GLUCOSEU NEGATIVE 03/31/2019 1630    HGBUR LARGE (A) 03/31/2019 1630   BILIRUBINUR NEGATIVE 03/31/2019 1630   KETONESUR NEGATIVE 03/31/2019 1630   PROTEINUR 30 (A) 03/31/2019 1630   NITRITE NEGATIVE 03/31/2019 1630   LEUKOCYTESUR NEGATIVE 03/31/2019 1630   Sepsis Labs: @LABRCNTIP (procalcitonin:4,lacticidven:4) ) Recent Results (from the past 240 hour(s))  SARS Coronavirus 2 (CEPHEID- Performed [facility]), Hosp Order     Status: None   Collection Time: 03/31/19 12:54 PM   Specimen: Nasopharyngeal Swab  Result Value Ref Range Status   SARS Coronavirus 2 NEGATIVE NEGATIVE Final    Comment: (NOTE) If result is NEGATIVE SARS-CoV-2 target nucleic acids are NOT DETECTED. The SARS-CoV-2 RNA is generally detectable in upper and lower  respiratory specimens during the acute phase of infection. The lowest  concentration of SARS-CoV-2 viral copies this assay can detect is 250  copies / mL. A negative result does not preclude SARS-CoV-2 infection  and should not be  used as the sole basis for treatment or other  patient management decisions.  A negative result may occur with  improper specimen collection / handling, submission of specimen other  than nasopharyngeal swab, presence of viral mutation(s) within the  areas targeted by this assay, and inadequate number of viral copies  (<250 copies / mL). A negative result must be combined with clinical  observations, patient history, and epidemiological information. If result is POSITIVE SARS-CoV-2 target nucleic acids are DETECTED. The SARS-CoV-2 RNA is generally detectable in upper and lower  respiratory specimens dur ing the acute phase of infection.  Positive  results are indicative of active infection with SARS-CoV-2.  Clinical  correlation with patient history and other diagnostic information is  necessary to determine patient infection status.  Positive results do  not rule out bacterial infection or co-infection with other viruses. If result is PRESUMPTIVE  POSTIVE SARS-CoV-2 nucleic acids MAY BE PRESENT.   A presumptive positive result was obtained on the submitted specimen  and confirmed on repeat testing.  While 2019 novel coronavirus  (SARS-CoV-2) nucleic acids may be present in the submitted sample  additional confirmatory testing may be necessary for epidemiological  and / or clinical management purposes  to differentiate between  SARS-CoV-2 and other Sarbecovirus currently known to infect humans.  If clinically indicated additional testing with an alternate test  methodology [phone]) is advised. The SARS-CoV-2 RNA is generally  detectable in upper and lower respiratory sp ecimens during the acute  phase of infection. The expected result is Negative. Fact Sheet for Patients:  StrictlyIdeas.no Fact Sheet for Healthcare Providers: BankingDealers.co.za This test is not yet approved or cleared by the [place] FDA and has been authorized for detection and/or diagnosis of SARS-CoV-2 by FDA under an Emergency Use Authorization (EUA).  This EUA will remain in effect (meaning this test can be used) for the duration of the COVID-19 declaration under Section 564(b)(1) of the Act, 21 U.S.C. section 360bbb-3(b)(1), unless the authorization is terminated or revoked sooner. Performed at [facility], [address] [place], [place] [address]      Radiological Exams on Admission: Ct Angio Chest Pe W/cm &/or Wo Cm  Result Date: 03/31/2019 CLINICAL DATA:  Shortness of breath. EXAM: CT ANGIOGRAPHY CHEST CT ABDOMEN AND PELVIS WITH CONTRAST TECHNIQUE: Multidetector CT imaging of the chest was performed using the standard protocol during bolus administration of intravenous contrast. Multiplanar CT image reconstructions and MIPs were obtained to evaluate the vascular anatomy. Multidetector CT imaging of the abdomen and pelvis was performed using the standard protocol during bolus administration of  intravenous contrast. CONTRAST:  154mL OMNIPAQUE IOHEXOL 350 MG/ML SOLN COMPARISON:  None. FINDINGS: CTA CHEST FINDINGS Cardiovascular: Satisfactory opacification of the pulmonary arteries to the segmental level. No evidence of pulmonary embolism. Normal heart size. No pericardial effusion. Age advanced calcific atherosclerotic disease of the coronary arteries Mediastinum/Nodes: No enlarged mediastinal, hilar, or axillary lymph nodes. Thyroid gland, trachea, and esophagus demonstrate no significant findings. Lungs/Pleura: Focal airspace consolidation versus atelectasis in the right lower lobe. Milder atelectatic changes versus airspace consolidation in the right middle lobe and lingula. No significant pleural effusion or pneumothorax. Musculoskeletal: No chest wall abnormality. No acute or significant osseous findings. Review of the MIP images confirms the above findings. CT ABDOMEN and PELVIS FINDINGS Hepatobiliary: Normal contour and attenuation of the liver. The gallbladder is decompressed and therefore poorly evaluated. Small amount of free fluid tracks in the perihepatic space and in the gallbladder fossa. Pancreas: Unremarkable. No pancreatic  ductal dilatation or surrounding inflammatory changes. Spleen: Normal in size without focal abnormality. Adrenals/Urinary Tract: Adrenal glands are unremarkable. Kidneys are without focal lesion, or hydronephrosis. 3 mm nonobstructive calculus in the right renal pelvis. Bladder is unremarkable. Stomach/Bowel: Stomach is within normal limits. Appendix appears normal. No evidence of bowel wall thickening, distention, or inflammatory changes. Vascular/Lymphatic: Aortic atherosclerosis. No enlarged abdominal or pelvic lymph nodes. Reproductive: Prostate is unremarkable. Other: No abdominal wall hernia or abnormality. Musculoskeletal: No acute or significant osseous findings. Diffuse abdominal wall subcutaneous edema. Review of the MIP images confirms the above findings.  IMPRESSION: 1. No evidence of pulmonary embolus. 2. Age advanced calcific atherosclerotic disease of the coronary arteries. 3. Focal airspace consolidation versus atelectasis in the right lower lobe. Milder atelectatic changes versus airspace consolidation in the right middle lobe and lingula. 4. Small amount of perihepatic ascites, which also tract within the gallbladder fossa. The gallbladder is poorly evaluated due to its collapsed state. 5. 3 mm nonobstructive calculus in the right renal pelvis. 6. Diffuse abdominal wall subcutaneous edema. Electronically Signed   By: Ted Mcalpine M.D.   On: 03/31/2019 14:52   Ct Abdomen Pelvis W Contrast  Result Date: 03/31/2019 CLINICAL DATA:  Shortness of breath. EXAM: CT ANGIOGRAPHY CHEST CT ABDOMEN AND PELVIS WITH CONTRAST TECHNIQUE: Multidetector CT imaging of the chest was performed using the standard protocol during bolus administration of intravenous contrast. Multiplanar CT image reconstructions and MIPs were obtained to evaluate the vascular anatomy. Multidetector CT imaging of the abdomen and pelvis was performed using the standard protocol during bolus administration of intravenous contrast. CONTRAST:  OMNIPAQUE IOHEXOL 350 MG/ML SOLN COMPARISON:  None. FINDINGS: CTA CHEST FINDINGS Cardiovascular: Satisfactory opacification of the pulmonary arteries to the segmental level. No evidence of pulmonary embolism. Normal heart size. No pericardial effusion. Age advanced calcific atherosclerotic disease of the coronary arteries Mediastinum/Nodes: No enlarged mediastinal, hilar, or axillary lymph nodes. Thyroid gland, trachea, and esophagus demonstrate no significant findings. Lungs/Pleura: Focal airspace consolidation versus atelectasis in the right lower lobe. Milder atelectatic changes versus airspace consolidation in the right middle lobe and lingula. No significant pleural effusion or pneumothorax. Musculoskeletal: No chest wall abnormality. No acute or  significant osseous findings. Review of the MIP images confirms the above findings. CT ABDOMEN and PELVIS FINDINGS Hepatobiliary: Normal contour and attenuation of the liver. The gallbladder is decompressed and therefore poorly evaluated. Small amount of free fluid tracks in the perihepatic space and in the gallbladder fossa. Pancreas: Unremarkable. No pancreatic ductal dilatation or surrounding inflammatory changes. Spleen: Normal in size without focal abnormality. Adrenals/Urinary Tract: Adrenal glands are unremarkable. Kidneys are without focal lesion, or hydronephrosis. 3 mm nonobstructive calculus in the right renal pelvis. Bladder is unremarkable. Stomach/Bowel: Stomach is within normal limits. Appendix appears normal. No evidence of bowel wall thickening, distention, or inflammatory changes. Vascular/Lymphatic: Aortic atherosclerosis. No enlarged abdominal or pelvic lymph nodes. Reproductive: Prostate is unremarkable. Other: No abdominal wall hernia or abnormality. Musculoskeletal: No acute or significant osseous findings. Diffuse abdominal wall subcutaneous edema. Review of the MIP images confirms the above findings. IMPRESSION: 1. No evidence of pulmonary embolus. 2. Age advanced calcific atherosclerotic disease of the coronary arteries. 3. Focal airspace consolidation versus atelectasis in the right lower lobe. Milder atelectatic changes versus airspace consolidation in the right middle lobe and lingula. 4. Small amount of perihepatic ascites, which also tract within the gallbladder fossa. The gallbladder is poorly evaluated due to its collapsed state. 5. 3 mm nonobstructive calculus in the right renal  pelvis. 6. Diffuse abdominal wall subcutaneous edema. Electronically Signed   By: Ted Mcalpineobrinka  Dimitrova M.D.   On: 03/31/2019 14:52   Dg Chest Portable 1 View  Result Date: 03/31/2019 CLINICAL DATA:  Shortness of breath. Lower abdominal pain. EXAM: PORTABLE CHEST 1 VIEW COMPARISON:  10/10/2018. FINDINGS:  Breathing motion blurring. Grossly stable enlarged cardiac silhouette, pulmonary vascular congestion and minimal prominent interstitial markings. Interval right basilar airspace opacity. Stable linear scarring at the left lung base. The visualized bones are unremarkable. IMPRESSION: 1. Interval right basilar atelectasis or pneumonia. 2. Stable cardiomegaly, pulmonary vascular congestion and minimal chronic interstitial lung disease. Electronically Signed   By: Beckie SaltsSteven  Reid M.D.   On: 03/31/2019 13:02    EKG: Independently reviewed.  Junctional tachycardia.  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  No acute changes  Assessment/Plan: Principal Problem:   Acute respiratory failure with hypoxia and hypercapnia (HCC) Active Problems:   CAP (community acquired pneumonia)   Elevated serum creatinine   Acute CHF (congestive heart failure) (HCC)   Obesity, morbid, BMI 50 or higher (HCC)   Abdominal pain   Hypertension    This patient was discussed with the ED physician, including pertinent vitals, physical exam findings, labs, and imaging.  We also discussed care given by the ED provider.  1. Acute respiratory failure with hypoxia hypercarbia a. Admit to stepdown on BiPAP b. Anticipate more than 48 hours needed in order to correct the patient's hypoxia and hypercarbia. 2. CA pneumonia Antibiotics: Rocephin and azithromycin Robitussin Blood cultures drawn in the emergency department Sputum cultures CBC tomorrow Strep antigen by urine respiratory virus panel 3. Elevated serum creatinine a. Will check creatinine tomorrow to evaluate whether this is acute or chronic. 4. Acute CHF Telemetry monitoring Strict I/O Daily Weights Diuresis: Lasix 40 mg twice daily Potassium: 20 mEq twice a day by mouth Echo cardiac exam tomorrow Repeat BMP tomorrow 5. Obesity 6. Abdominal pain a. No evidence of abdominal pain on my exam b. No acute process on CT  7. Hypertension a. Hold lisinopril due to CTA b. Continue antihypertensives  DVT prophylaxis: Lovenox Consultants: None Code Status: Full code Family Communication: None Disposition Plan: Patient should be able to return home   Levie HeritageStinson, Shayra Anton J, DO

## 2019-03-31 NOTE — ED Notes (Signed)
Date and time results received: 03/31/19 1344 (use smartphrase ".now" to insert current time)  Test: pCO2  Critical Value: 78.5  Name of Provider Notified: Dr Laverta Baltimore  Orders Received? Or Actions Taken?:

## 2019-03-31 NOTE — ED Provider Notes (Signed)
[facility] EMERGENCY DEPARTMENT Provider Note   CSN: [id number] Arrival date & time: 03/31/19  1057    History   Chief Complaint Chief Complaint  Patient presents with   Abdominal Pain    HPI Arvella NighClaude Toscano is a 58 y.o. male presented to the emergency room for evaluation of abdominal pain.  Patient states the past 2 weeks, he has had gradually worsening lower abdominal pain.  Patient states it began when he felt like he had a hernia in his right groin.  This began after he was lifting a lawnmower into a truck.  Patient states pain has been gradually worsening.  Pain is worse with palpation and movement, no change with oral intake or bowel movements.  He denies history of similar.  He cannot state whether the pain is in the same spot or moves around.  He denies fevers, chills, nausea, vomiting, or urinary symptoms.  He has been taking Xanax as needed for pain, last dose around 6:00 this morning.  He has not taken anything else for pain.  Patient reports a history of COPD which began in 2018 after getting pneumonia.  He continues to smoke cigarettes daily.  Does not uses his inhaler frequently, has not used it recently.  He states he is having slight increase in difficulty breathing from his baseline, but no significant change.  He reports several years of worsening bilateral lower extremity edema, but no acute change.  He is on lasix, no recent change in dose.  Denies recent medication change.  He denies sick contacts.  He denies contact with COVID-19 positive person.     HPI  Past Medical History:  Diagnosis Date   Anxiety    Chronic back pain 2011   History of fracture of clavicle    Right   Hyperlipidemia    Hypertension    Osteoarthritis     Patient Active Problem List   Diagnosis Date Noted   Acute respiratory failure with hypoxia and hypercapnia (HCC) 03/31/2019    Past Surgical History:  Procedure Laterality Date   TONSILLECTOMY          Home  Medications    Prior to Admission medications   Medication Sig Start Date End Date Taking? Authorizing Provider  ALPRAZolam Prudy Feeler(XANAX) 1 MG tablet Take 1 mg by mouth every 6 (six) hours as needed for anxiety.    [provider]  amLODipine (NORVASC) 10 MG tablet Take 10 mg by mouth daily.    [provider]  aspirin 81 MG tablet Take 81 mg by mouth daily.    [provider]  atorvastatin (LIPITOR) 10 MG tablet Take 10 mg by mouth daily.    [provider]  lisinopril (PRINIVIL,ZESTRIL) 40 MG tablet Take 40 mg by mouth daily.    [provider]  traMADol (ULTRAM) 50 MG tablet Take 50 mg by mouth every 6 (six) hours as needed.    [provider]    Family History Family History  Problem Relation Age of Onset   Ovarian cancer Mother    Diabetes Father     Social History Social History   Tobacco Use   Smoking status: Current Every Day Smoker    Packs/day: 0.50    Years: 25.00    Pack years: 12.50    Types: Cigarettes   Smokeless tobacco: Never Used  Substance Use Topics   Alcohol use: Yes    Comment: Occasional   Drug use: No     Allergies  Patient has no known allergies.   Review of Systems Review of Systems  Respiratory: Positive for shortness of breath.   Cardiovascular: Positive for leg swelling.  Gastrointestinal: Positive for abdominal pain.  All other systems reviewed and are negative.    Physical Exam Updated Vital Signs BP (!) 107/46    Pulse (!) 127    Temp 97.9 F (36.6 C)    Resp 19    Ht 5\' 10"  (1.778 m)    Wt (!) 179 kg    SpO2 92%    BMI 56.62 kg/m   Physical Exam Vitals signs and nursing note reviewed.  Constitutional:      Appearance: He is obese. He is ill-appearing.     Comments: Pt appears ill. Could be chronic, however is is very somnolent on exam.   HENT:     Head: Normocephalic and atraumatic.  Eyes:     Conjunctiva/sclera: Conjunctivae normal.     Pupils: Pupils are equal,  round, and reactive to light.  Neck:     Musculoskeletal: Normal range of motion and neck supple.  Cardiovascular:     Rate and Rhythm: Regular rhythm. Tachycardia present.     Pulses: Normal pulses.     Comments: Tachycardic around 130 Pulmonary:     Effort: Prolonged expiration present.     Breath sounds: Wheezing present.     Comments: Pt with prolonged expiration and slight increased WOB. Wheezing heard with pt self-PEEPs. Minimal wheezes on exam, although exam limited by body habitus. SpO2 82% on RA on my exam Abdominal:     Palpations: Abdomen is soft.     Comments: Obese, difficult to assess for distention. Lower abd skin with redness and pustules, chronic per chart review. Large pannus, unable to palpated obvious hernia, but difficult exam due to body habitus.   Musculoskeletal: Normal range of motion.  Skin:    General: Skin is warm and dry.     Capillary Refill: Capillary refill takes less than 2 seconds.  Neurological:     Mental Status: He is oriented to person, place, and time.     Comments: Pt very somnolent. Will awake for questions and respond appropriately, but immediately falls back asleep. Alert to person, place, and event      ED Treatments / Results  Labs (all labs ordered are listed, but only abnormal results are displayed) Labs Reviewed  CBC WITH DIFFERENTIAL/PLATELET - Abnormal; Notable for the following components:      Result Value   MCV 113.8 (*)    MCH 34.2 (*)    nRBC 0.3 (*)    All other components within normal limits  COMPREHENSIVE METABOLIC PANEL - Abnormal; Notable for the following components:   Sodium 146 (*)    Glucose, Bld 159 (*)    BUN 41 (*)    Creatinine, Ser 1.47 (*)    Calcium 8.3 (*)    Albumin 3.4 (*)    GFR calc non Af Amer 52 (*)    All other components within normal limits  URINALYSIS, ROUTINE W REFLEX MICROSCOPIC - Abnormal; Notable for the following components:   Hgb urine dipstick LARGE (*)    Protein, ur 30 (*)    RBC  / HPF >50 (*)    All other components within normal limits  BRAIN NATRIURETIC PEPTIDE - Abnormal; Notable for the following components:   B Natriuretic Peptide 141.0 (*)    All other components within normal limits  BLOOD GAS, ARTERIAL - Abnormal; Notable for  the following components:   pH, Arterial 7.250 (*)    pCO2 arterial 78.5 (*)    pO2, Arterial 71.4 (*)    Acid-Base Excess 6.4 (*)    All other components within normal limits  BLOOD GAS, ARTERIAL - Abnormal; Notable for the following components:   pH, Arterial 7.180 (*)    pCO2 arterial 95.8 (*)    Acid-Base Excess 6.3 (*)    All other components within normal limits  BLOOD GAS, ARTERIAL - Abnormal; Notable for the following components:   pH, Arterial 7.220 (*)    pCO2 arterial 85.3 (*)    Acid-Base Excess 6.3 (*)    All other components within normal limits  SARS CORONAVIRUS 2 (HOSPITAL ORDER, PERFORMED IN [facility])  LIPASE, BLOOD  LACTIC ACID, PLASMA  LACTIC ACID, PLASMA    EKG EKG Interpretation  Date/Time:  Friday March 31 2019 12:25:29 EDT Ventricular Rate:  126 PR Interval:    QRS Duration: 155 QT Interval:  365 QTC Calculation: 529 R Axis:   137 Text Interpretation:  Junctional tachycardia RBBB and LPFB Probable inferior infarct, age indeterminate Lateral leads are also involved Baseline wander in lead(s) V5 No prior for comparison. No STEMI  Confirmed by Alona BeneLong, Joshua [phone]([id number]) on 03/31/2019 12:56:44 PM   Radiology Ct Angio Chest Pe W/cm &/or Wo Cm  Result Date: 03/31/2019 CLINICAL DATA:  Shortness of breath. EXAM: CT ANGIOGRAPHY CHEST CT ABDOMEN AND PELVIS WITH CONTRAST TECHNIQUE: Multidetector CT imaging of the chest was performed using the standard protocol during bolus administration of intravenous contrast. Multiplanar CT image reconstructions and MIPs were obtained to evaluate the vascular anatomy. Multidetector CT imaging of the abdomen and pelvis was performed using the standard protocol  during bolus administration of intravenous contrast. CONTRAST:  100mL OMNIPAQUE IOHEXOL 350 MG/ML SOLN COMPARISON:  None. FINDINGS: CTA CHEST FINDINGS Cardiovascular: Satisfactory opacification of the pulmonary arteries to the segmental level. No evidence of pulmonary embolism. Normal heart size. No pericardial effusion. Age advanced calcific atherosclerotic disease of the coronary arteries Mediastinum/Nodes: No enlarged mediastinal, hilar, or axillary lymph nodes. Thyroid gland, trachea, and esophagus demonstrate no significant findings. Lungs/Pleura: Focal airspace consolidation versus atelectasis in the right lower lobe. Milder atelectatic changes versus airspace consolidation in the right middle lobe and lingula. No significant pleural effusion or pneumothorax. Musculoskeletal: No chest wall abnormality. No acute or significant osseous findings. Review of the MIP images confirms the above findings. CT ABDOMEN and PELVIS FINDINGS Hepatobiliary: Normal contour and attenuation of the liver. The gallbladder is decompressed and therefore poorly evaluated. Small amount of free fluid tracks in the perihepatic space and in the gallbladder fossa. Pancreas: Unremarkable. No pancreatic ductal dilatation or surrounding inflammatory changes. Spleen: Normal in size without focal abnormality. Adrenals/Urinary Tract: Adrenal glands are unremarkable. Kidneys are without focal lesion, or hydronephrosis. 3 mm nonobstructive calculus in the right renal pelvis. Bladder is unremarkable. Stomach/Bowel: Stomach is within normal limits. Appendix appears normal. No evidence of bowel wall thickening, distention, or inflammatory changes. Vascular/Lymphatic: Aortic atherosclerosis. No enlarged abdominal or pelvic lymph nodes. Reproductive: Prostate is unremarkable. Other: No abdominal wall hernia or abnormality. Musculoskeletal: No acute or significant osseous findings. Diffuse abdominal wall subcutaneous edema. Review of the MIP images  confirms the above findings. IMPRESSION: 1. No evidence of pulmonary embolus. 2. Age advanced calcific atherosclerotic disease of the coronary arteries. 3. Focal airspace consolidation versus atelectasis in the right lower lobe. Milder atelectatic changes versus airspace consolidation in the right middle lobe and lingula. 4.  Small amount of perihepatic ascites, which also tract within the gallbladder fossa. The gallbladder is poorly evaluated due to its collapsed state. 5. 3 mm nonobstructive calculus in the right renal pelvis. 6. Diffuse abdominal wall subcutaneous edema. Electronically Signed   By: Ted Mcalpineobrinka  Dimitrova M.D.   On: 03/31/2019 14:52   Ct Abdomen Pelvis W Contrast  Result Date: 03/31/2019 CLINICAL DATA:  Shortness of breath. EXAM: CT ANGIOGRAPHY CHEST CT ABDOMEN AND PELVIS WITH CONTRAST TECHNIQUE: Multidetector CT imaging of the chest was performed using the standard protocol during bolus administration of intravenous contrast. Multiplanar CT image reconstructions and MIPs were obtained to evaluate the vascular anatomy. Multidetector CT imaging of the abdomen and pelvis was performed using the standard protocol during bolus administration of intravenous contrast. CONTRAST:  100mL OMNIPAQUE IOHEXOL 350 MG/ML SOLN COMPARISON:  None. FINDINGS: CTA CHEST FINDINGS Cardiovascular: Satisfactory opacification of the pulmonary arteries to the segmental level. No evidence of pulmonary embolism. Normal heart size. No pericardial effusion. Age advanced calcific atherosclerotic disease of the coronary arteries Mediastinum/Nodes: No enlarged mediastinal, hilar, or axillary lymph nodes. Thyroid gland, trachea, and esophagus demonstrate no significant findings. Lungs/Pleura: Focal airspace consolidation versus atelectasis in the right lower lobe. Milder atelectatic changes versus airspace consolidation in the right middle lobe and lingula. No significant pleural effusion or pneumothorax. Musculoskeletal: No chest  wall abnormality. No acute or significant osseous findings. Review of the MIP images confirms the above findings. CT ABDOMEN and PELVIS FINDINGS Hepatobiliary: Normal contour and attenuation of the liver. The gallbladder is decompressed and therefore poorly evaluated. Small amount of free fluid tracks in the perihepatic space and in the gallbladder fossa. Pancreas: Unremarkable. No pancreatic ductal dilatation or surrounding inflammatory changes. Spleen: Normal in size without focal abnormality. Adrenals/Urinary Tract: Adrenal glands are unremarkable. Kidneys are without focal lesion, or hydronephrosis. 3 mm nonobstructive calculus in the right renal pelvis. Bladder is unremarkable. Stomach/Bowel: Stomach is within normal limits. Appendix appears normal. No evidence of bowel wall thickening, distention, or inflammatory changes. Vascular/Lymphatic: Aortic atherosclerosis. No enlarged abdominal or pelvic lymph nodes. Reproductive: Prostate is unremarkable. Other: No abdominal wall hernia or abnormality. Musculoskeletal: No acute or significant osseous findings. Diffuse abdominal wall subcutaneous edema. Review of the MIP images confirms the above findings. IMPRESSION: 1. No evidence of pulmonary embolus. 2. Age advanced calcific atherosclerotic disease of the coronary arteries. 3. Focal airspace consolidation versus atelectasis in the right lower lobe. Milder atelectatic changes versus airspace consolidation in the right middle lobe and lingula. 4. Small amount of perihepatic ascites, which also tract within the gallbladder fossa. The gallbladder is poorly evaluated due to its collapsed state. 5. 3 mm nonobstructive calculus in the right renal pelvis. 6. Diffuse abdominal wall subcutaneous edema. Electronically Signed   By: Ted Mcalpineobrinka  Dimitrova M.D.   On: 03/31/2019 14:52   Dg Chest Portable 1 View  Result Date: 03/31/2019 CLINICAL DATA:  Shortness of breath. Lower abdominal pain. EXAM: PORTABLE CHEST 1 VIEW  COMPARISON:  10/10/2018. FINDINGS: Breathing motion blurring. Grossly stable enlarged cardiac silhouette, pulmonary vascular congestion and minimal prominent interstitial markings. Interval right basilar airspace opacity. Stable linear scarring at the left lung base. The visualized bones are unremarkable. IMPRESSION: 1. Interval right basilar atelectasis or pneumonia. 2. Stable cardiomegaly, pulmonary vascular congestion and minimal chronic interstitial lung disease. Electronically Signed   By: Beckie SaltsSteven  Reid M.D.   On: 03/31/2019 13:02    Procedures .Critical Care Performed by: Alveria Apleyaccavale, Yanina Knupp, PA-C Authorized by: Alveria Apleyaccavale, Korinne Greenstein, PA-C   Critical care provider statement:  Critical care time (minutes):  60   Critical care time was exclusive of:  Separately billable procedures and treating other patients and teaching time   Critical care was necessary to treat or prevent imminent or life-threatening deterioration of the following conditions:  Respiratory failure   Critical care was time spent personally by me on the following activities:  Blood draw for specimens, development of treatment plan with patient or surrogate, evaluation of patient's response to treatment, examination of patient, obtaining history from patient or surrogate, ordering and performing treatments and interventions, ordering and review of laboratory studies, ordering and review of radiographic studies, pulse oximetry, re-evaluation of patient's condition and review of old charts   I assumed direction of critical care for this patient from another provider in my specialty: no   Comments:     Pt very somnolent, with increased work of breathing, critically high pCO2. Pt placed on bipap   (including critical care time)  Medications Ordered in ED Medications  fentaNYL (SUBLIMAZE) injection 12.5 mcg (12.5 mcg Intravenous Not Given 03/31/19 1459)  hydrogen peroxide 3 % external solution (has no administration in time range)    albuterol (VENTOLIN HFA) 108 (90 Base) MCG/ACT inhaler 6 puff (6 puffs Inhalation Given 03/31/19 1312)  ipratropium (ATROVENT HFA) inhaler 2 puff (2 puffs Inhalation Given 03/31/19 1314)  iohexol (OMNIPAQUE) 350 MG/ML injection 100 mL (100 mLs Intravenous Contrast Given 03/31/19 1354)  cefTRIAXone (ROCEPHIN) 1 g in sodium chloride 0.9 % 100 mL IVPB (0 g Intravenous Stopped 03/31/19 1713)  azithromycin (ZITHROMAX) 500 mg in sodium chloride 0.9 % 250 mL IVPB (500 mg Intravenous New Bag/Given 03/31/19 1717)     Initial Impression / Assessment and Plan / ED Course  I have reviewed the triage vital signs and the nursing notes.  Pertinent labs & imaging results that were available during my care of the patient were reviewed by me and considered in my medical decision making (see chart for details).        Pt presenting for evaluation of abdominal pain.  However, my concern I am very concerned about his somnolence.  Also concerned about his increased work of breathing and prolonged expiration.  Patient appears fluid overloaded with bilateral pitting edema and wheezing on exam.  Abdominal exam difficult due to obesity, but without obvious incarcerated hernia or surgical abdomen.  Will order labs, x-ray, urine.  Albuterol and ipratropium given for possible worsening COPD.  Informed by RN that patient cannot stay awake long enough to use the inhaler.  Will order ABG.  Concern that patient may need BiPAP, COVID pending. Case discussed with attending, Dr. Jacqulyn Bath evaluated the pt.   X-ray shows pneumonia versus fluid versus atelectasis.  Will order CTA and CT abdomen pelvis, as patient was hypoxic and tachycardic and short of breath on arrival, thus increasing my concern for PE.  PCO2 critically elevated at 75.  Patient given negative, placed on BiPAP.  CTA negative for PE, has consolidation versus atelectasis of the right lobe.  CT abdomen pelvis shows perihepatic ascites, but no obvious hernia or emergent  intra-abdominal infections.  1 hour after BiPAP, patient's PCO2 increased to 98.  Patient remains somnolent.  Concern that he may need to be intubated due to his mental status. Discussed with new attending, Dr. Deretha Emory evaluated the pt. he is concerned about possible early pneumonia, will start antibiotics.  Will hold on fluids for right now.  Will order lactic  Pt became more awake and alert. refusing intubation.  Lactic  negative.  Repeat ABG shows improved hypercarbia at 85.  Will call for admission, as patient remains alert and oriented and stable on BiPAP.  Discussed with Dr[facility] Adrian [facility], patient to be admitted.   Final Clinical Impressions(s) / ED Diagnoses   Final diagnoses:  Acute on chronic respiratory failure with hypercapnia (HCC)  Community acquired pneumonia of right lower lobe of lung [facility])    ED Discharge Orders    None       Alveria Apley, PA-C 03/31/19 1917    Maia Plan, MD 03/31/19 2007

## 2019-03-31 NOTE — ED Triage Notes (Signed)
ABDOMINAL PAIN FOR 2 WEEKS

## 2019-03-31 NOTE — ED Notes (Signed)
ED Provider at bedside. 

## 2019-03-31 NOTE — ED Notes (Signed)
Date and time results received: 03/31/19 1538 (use smartphrase ".now" to insert current time)  Test: pH and pCO2 Critical Value: pH 7.18                         PCO2 95.8  Name of Provider Notified: Zackowski,MD  Orders Received? Or Actions Taken?: Respiratory called for intubation.

## 2019-03-31 NOTE — ED Notes (Signed)
Date and time results received: 03/31/19 0826 (use smartphrase ".now" to insert current time)  Test: pco2 Critical Value: 85.3  Name of Provider Notified: Dr. Rogene Houston  Orders Received? Or Actions Taken?: n/a

## 2019-03-31 NOTE — Progress Notes (Signed)
Pt transported from ER to ICU and BIPAP.  Pt tolerating well.  RT will continue to monitor.

## 2019-04-01 ENCOUNTER — Inpatient Hospital Stay (HOSPITAL_COMMUNITY): Payer: BC Managed Care – PPO

## 2019-04-01 DIAGNOSIS — J9602 Acute respiratory failure with hypercapnia: Secondary | ICD-10-CM

## 2019-04-01 DIAGNOSIS — J9601 Acute respiratory failure with hypoxia: Secondary | ICD-10-CM

## 2019-04-01 LAB — BASIC METABOLIC PANEL
Anion gap: 8 (ref 5–15)
BUN: 33 mg/dL — ABNORMAL HIGH (ref 6–20)
CO2: 30 mmol/L (ref 22–32)
Calcium: 8.2 mg/dL — ABNORMAL LOW (ref 8.9–10.3)
Chloride: 103 mmol/L (ref 98–111)
Creatinine, Ser: 1.17 mg/dL (ref 0.61–1.24)
GFR calc Af Amer: >60 mL/min (ref >60)
GFR calc non Af Amer: >60 mL/min (ref >60)
Glucose, Bld: 114 mg/dL — ABNORMAL HIGH (ref 70–99)
Potassium: 5.1 mmol/L (ref 3.5–5.1)
Sodium: 141 mmol/L (ref 135–145)

## 2019-04-01 LAB — CBC
HCT: 49.7 % (ref 39.0–52.0)
Hemoglobin: 14.7 g/dL (ref 13.0–17.0)
MCH: 34.3 pg — ABNORMAL HIGH (ref 26.0–34.0)
MCHC: 29.6 g/dL — ABNORMAL LOW (ref 30.0–36.0)
MCV: 116.1 fL — ABNORMAL HIGH (ref 80.0–100.0)
Platelets: 198 10*3/uL (ref 150–400)
RBC: 4.28 MIL/uL (ref 4.22–5.81)
RDW: 14.8 % (ref 11.5–15.5)
WBC: 8.5 10*3/uL (ref 4.0–10.5)
nRBC: 0.2 % (ref 0.0–0.2)

## 2019-04-01 LAB — BLOOD GAS, ARTERIAL
Acid-Base Excess: 6.1 mmol/L — ABNORMAL HIGH (ref 0.0–2.0)
Bicarbonate: 26.5 mmol/L (ref 20.0–28.0)
FIO2: 44
O2 Saturation: 95.8 %
Patient temperature: 37
pCO2 arterial: 89.1 mmHg (ref 32.0–48.0)
pH, Arterial: 7.203 — ABNORMAL LOW (ref 7.350–7.450)
pO2, Arterial: 92 mmHg (ref 83.0–108.0)

## 2019-04-01 LAB — STREP PNEUMONIAE URINARY ANTIGEN: Strep Pneumo Urinary Antigen: NEGATIVE

## 2019-04-01 LAB — PROCALCITONIN: Procalcitonin: <0.1 ng/mL

## 2019-04-01 LAB — MRSA PCR SCREENING: MRSA by PCR: NEGATIVE

## 2019-04-01 MED ORDER — IPRATROPIUM BROMIDE 0.02 % IN SOLN
0.5000 mg | Freq: Four times a day (QID) | RESPIRATORY_TRACT | Status: DC
  Administered 2019-04-01: 0.5 mg via RESPIRATORY_TRACT
  Filled 2019-04-01: qty 2.5

## 2019-04-01 MED ORDER — METHYLPREDNISOLONE SODIUM SUCC 125 MG IJ SOLR
80.0000 mg | Freq: Three times a day (TID) | INTRAMUSCULAR | Status: DC
  Administered 2019-04-01: 80 mg via INTRAVENOUS
  Filled 2019-04-01: qty 2

## 2019-04-01 MED ORDER — LEVALBUTEROL HCL 0.63 MG/3ML IN NEBU
0.6300 mg | INHALATION_SOLUTION | Freq: Four times a day (QID) | RESPIRATORY_TRACT | Status: DC
  Administered 2019-04-01: 0.63 mg via RESPIRATORY_TRACT
  Filled 2019-04-01: qty 3

## 2019-04-01 MED ORDER — BUDESONIDE 0.25 MG/2ML IN SUSP
0.2500 mg | Freq: Two times a day (BID) | RESPIRATORY_TRACT | Status: DC
  Administered 2019-04-01: 0.25 mg via RESPIRATORY_TRACT
  Filled 2019-04-01: qty 2

## 2019-04-01 NOTE — Progress Notes (Signed)
Patient had been on Bipap. Placed on cannula so he could eat. He called son and said he wanted to go to larger hospital and signed [place] papers. Talked to Doctor, patient talked to doctor, and patient alert and oriented and we could let him go.

## 2019-04-01 NOTE — Progress Notes (Signed)
Answered call light assigned RN at lunch. Pt on phone with Son and voicing that he wishes to "check out" and go to [facility]. I explained that I am not primary RN but I would notify primary nurse and also paged Dr. Manuella Ghazi. Dr. Manuella Ghazi came to bedside and is meeting now with pt. Pt's son remains on speaker phone and is conversing also with Dr. Manuella Ghazi.

## 2019-04-01 NOTE — Progress Notes (Signed)
PROGRESS NOTE    Jerome Kelly  ONG:[id number] DOB: October 29, 1960 DOA: 03/31/2019 PCP: Elfredia NevinsFusco, Lawrence, MD   Brief Narrative:  Per HPI: Jerome Kelly is a 58 y.o. male with a history of obesity, chronic pain, hyperlipidemia, hypertension.  Patient presents with abdominal pain x2 weeks in the right lower quadrant.  Began after lifting lawnmower into a truck.  Gradually worsening.  Pain worse with palpation and movement, but is able to eat and drink without any problems.  No fevers, chills, nausea, vomiting.  In addition, the patient began to be severely hypoxic and short of breath this evening on arrival in the emergency department, his oxygen saturation was less than 85% on room air.  He was placed on BiPAP due to hypersomnolence with gradual improvement.  ABGs were done showing severe hypercarbia.  Patient was admitted with acute combined respiratory failure with hypoxemia and hypercarbia in the setting of community-acquired pneumonia as well as CHF exacerbation.  He has been placed on IV diuresis as well as Rocephin and azithromycin and continues to require BiPAP with high PCO2 levels noted.  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  Assessment & Plan:   Principal Problem:   Acute respiratory failure with hypoxia and hypercapnia (HCC) Active Problems:   CAP (community acquired pneumonia)   Elevated serum creatinine   Acute CHF (congestive heart failure) (HCC)   Obesity, morbid, BMI 50 or higher (HCC)   Abdominal pain   Hypertension   Acute hypoxemic/hypercapnic respiratory failure-multifactorial -Appears to be related to CHF as well as pneumonia with elements of OHS -Continue on BiPAP for now with repeat ABG and chest x-ray plan for a.m. -Added Pulmicort as well as IV steroids today -Appreciate pulmonology consultation -2D echocardiogram pending -CT angiogram reviewed with no PE and some atelectasis noted  Community-acquired pneumonia -Agree with Rocephin and  azithromycin -Microbiology pending  Volume overload suspect acute CHF -2D echocardiogram pending -Monitor daily weights and strict I's and O's with diuresis -Continue Lasix 40 mg twice daily for now and plan to increase by tomorrow if output is not adequate. -Monitor repeat BMP  Obesity with suspected OSA/OHS -Patient will likely require BiPAP to at least wear nightly  Hypertension -Currently stable will continue to hold lisinopril   DVT prophylaxis: Lovenox Code Status: Full code Family Communication: None at bedside Disposition Plan: Continue diuresis as well as steroids and antibiotics and monitor cultures.  Appreciate assistance of pulmonology and wean BiPAP as tolerated.   Consultants:   Pulmonology  Procedures:   None  Antimicrobials:  Anti-infectives (From admission, onward)   Start     Dose/Rate Route Frequency Ordered Stop   04/01/19 0000  azithromycin (ZITHROMAX) 500 mg in sodium chloride 0.9 % 250 mL IVPB     500 mg 250 mL/hr over 60 Minutes Intravenous Every 24 hours 03/31/19 2156 04/05/19 2359   04/01/19 0000  cefTRIAXone (ROCEPHIN) 2 g in sodium chloride 0.9 % 100 mL IVPB     2 g 200 mL/hr over 30 Minutes Intravenous Every 24 hours 03/31/19 2156 04/05/19 2359   03/31/19 1600  cefTRIAXone (ROCEPHIN) 1 g in sodium chloride 0.9 % 100 mL IVPB     1 g 200 mL/hr over 30 Minutes Intravenous  Once 03/31/19 1547 03/31/19 1713   03/31/19 1600  azithromycin (ZITHROMAX) 500 mg in sodium chloride 0.9 % 250 mL IVPB     500 mg 250 mL/hr over 60 Minutes Intravenous  Once 03/31/19 1547 03/31/19 1940  Subjective: Patient seen and evaluated today and appears somewhat anxious on the BiPAP.  He continues to have shortness of breath as well as lower extremity swelling.  Objective: Vitals:   04/01/19 0500 04/01/19 0600 04/01/19 0700 04/01/19 0742  BP: 107/76 114/82 110/76   Pulse: (!) 125 (!) 124 (!) 124 (!) 128  Resp: 20 (!) 28 16 16   Temp: 98 F (36.7 C)    98.1 F (36.7 C)  TempSrc:    Oral  SpO2: 95% 93% 93% 93%  Weight:      Height:        Intake/Output Summary (Last 24 hours) at 04/01/2019 1000 Last data filed at 04/01/2019 0400 Gross per 24 hour  Intake 601.64 ml  Output 1150 ml  Net -548.36 ml   Filed Weights   03/31/19 1149 03/31/19 2200  Weight: (!) 179 kg (!) 179.6 kg    Examination:  General exam: Appears anxious and in mild distress, obese Respiratory system: Clear to auscultation. Respiratory effort normal.  Currently on BiPAP. Cardiovascular system: S1 & S2 heard, RRR-mildly tachycardic. No JVD, murmurs, rubs, gallops or clicks.  1+ pedal edema to midshin bilaterally. Gastrointestinal system: Abdomen is nondistended, soft and nontender. No organomegaly or masses felt. Normal bowel sounds heard. Central nervous system: Alert and oriented. No focal neurological deficits. Extremities: Symmetric 5 x 5 power. Skin: No rashes, lesions or ulcers Psychiatry: Judgement and insight appear normal. Mood is depressed.    Data Reviewed: I have personally reviewed following labs and imaging studies  CBC: Recent Labs  Lab 03/31/19 1250 04/01/19 0442  WBC 7.5 8.5  NEUTROABS 4.9  --   HGB 14.9 14.7  HCT 49.6 49.7  MCV 113.8* 116.1*  PLT 207 198   Basic Metabolic Panel: Recent Labs  Lab 03/31/19 1250 04/01/19 0442  NA 146* 141  K 4.7 5.1  CL 105 103  CO2 31 30  GLUCOSE 159* 114*  BUN 41* 33*  CREATININE 1.47* 1.17  CALCIUM 8.3* 8.2*   GFR: Estimated Creatinine Clearance: 113.9 mL/min (by C-G formula based on SCr of 1.17 mg/dL). Liver Function Tests: Recent Labs  Lab 03/31/19 1250  AST 20  ALT 28  ALKPHOS 99  BILITOT 0.6  PROT 6.6  ALBUMIN 3.4*   Recent Labs  Lab 03/31/19 1250  LIPASE 28   No results for input(s): AMMONIA in the last 168 hours. Coagulation Profile: No results for input(s): INR, PROTIME in the last 168 hours. Cardiac Enzymes: No results for input(s): CKTOTAL, CKMB, CKMBINDEX,  TROPONINI in the last 168 hours. BNP (last 3 results) No results for input(s): PROBNP in the last 8760 hours. HbA1C: No results for input(s): HGBA1C in the last 72 hours. CBG: No results for input(s): GLUCAP in the last 168 hours. Lipid Profile: No results for input(s): CHOL, HDL, LDLCALC, TRIG, CHOLHDL, LDLDIRECT in the last 72 hours. Thyroid Function Tests: No results for input(s): TSH, T4TOTAL, FREET4, T3FREE, THYROIDAB in the last 72 hours. Anemia Panel: No results for input(s): VITAMINB12, FOLATE, FERRITIN, TIBC, IRON, RETICCTPCT in the last 72 hours. Sepsis Labs: Recent Labs  Lab 03/31/19 1613 03/31/19 1829 04/01/19 0442  PROCALCITON  --  <0.10 <0.10  LATICACIDVEN 0.9 1.0  --     Recent Results (from the past 240 hour(s))  SARS Coronavirus 2 (CEPHEID- Performed in [facility] hospital lab), Hosp Order     Status: None   Collection Time: 03/31/19 12:54 PM   Specimen: Nasopharyngeal Swab  Result Value Ref Range Status  SARS Coronavirus 2 NEGATIVE NEGATIVE Final    Comment: (NOTE) If result is NEGATIVE SARS-CoV-2 target nucleic acids are NOT DETECTED. The SARS-CoV-2 RNA is generally detectable in upper and lower  respiratory specimens during the acute phase of infection. The lowest  concentration of SARS-CoV-2 viral copies this assay can detect is 250  copies / mL. A negative result does not preclude SARS-CoV-2 infection  and should not be used as the sole basis for treatment or other  patient management decisions.  A negative result may occur with  improper specimen collection / handling, submission of specimen other  than nasopharyngeal swab, presence of viral mutation(s) within the  areas targeted by this assay, and inadequate number of viral copies  (<250 copies / mL). A negative result must be combined with clinical  observations, patient history, and epidemiological information. If result is POSITIVE SARS-CoV-2 target nucleic acids are DETECTED. The SARS-CoV-2  RNA is generally detectable in upper and lower  respiratory specimens dur ing the acute phase of infection.  Positive  results are indicative of active infection with SARS-CoV-2.  Clinical  correlation with patient history and other diagnostic information is  necessary to determine patient infection status.  Positive results do  not rule out bacterial infection or co-infection with other viruses. If result is PRESUMPTIVE POSTIVE SARS-CoV-2 nucleic acids MAY BE PRESENT.   A presumptive positive result was obtained on the submitted specimen  and confirmed on repeat testing.  While 2019 novel coronavirus  (SARS-CoV-2) nucleic acids may be present in the submitted sample  additional confirmatory testing may be necessary for epidemiological  and / or clinical management purposes  to differentiate between  SARS-CoV-2 and other Sarbecovirus currently known to infect humans.  If clinically indicated additional testing with an alternate test  methodology [phone]([id number]) is advised. The SARS-CoV-2 RNA is generally  detectable in upper and lower respiratory sp ecimens during the acute  phase of infection. The expected result is Negative. Fact Sheet for Patients:  BoilerBrush.com.cyhttps://www.fda.gov/media/136312/download Fact Sheet for Healthcare Providers: https://pope.com/https://www.fda.gov/media/136313/download This test is not yet approved or cleared by the [place] FDA and has been authorized for detection and/or diagnosis of SARS-CoV-2 by FDA under an Emergency Use Authorization (EUA).  This EUA will remain in effect (meaning this test can be used) for the duration of the COVID-19 declaration under Section 564(b)(1) of the Act, 21 U.S.C. section 360bbb-3(b)(1), unless the authorization is terminated or revoked sooner. Performed at [facility], [address] [place], [place] [address]   MRSA PCR Screening     Status: None   Collection Time: 03/31/19  9:53 PM   Specimen: Nasal Mucosa; Nasopharyngeal  Result Value Ref  Range Status   MRSA by PCR NEGATIVE NEGATIVE Final    Comment:        The GeneXpert MRSA Assay (FDA approved for NASAL specimens only), is one component of a comprehensive MRSA colonization surveillance program. It is not intended to diagnose MRSA infection nor to guide or monitor treatment for MRSA infections. Performed at [facility], [address] [place], [place] [address]          Radiology Studies: Ct Angio Chest Pe W/cm &/or Wo Cm  Result Date: 03/31/2019 CLINICAL DATA:  Shortness of breath. EXAM: CT ANGIOGRAPHY CHEST CT ABDOMEN AND PELVIS WITH CONTRAST TECHNIQUE: Multidetector CT imaging of the chest was performed using the standard protocol during bolus administration of intravenous contrast. Multiplanar CT image reconstructions and MIPs were obtained to evaluate the vascular anatomy. Multidetector CT  imaging of the abdomen and pelvis was performed using the standard protocol during bolus administration of intravenous contrast. CONTRAST:  124mL OMNIPAQUE IOHEXOL 350 MG/ML SOLN COMPARISON:  None. FINDINGS: CTA CHEST FINDINGS Cardiovascular: Satisfactory opacification of the pulmonary arteries to the segmental level. No evidence of pulmonary embolism. Normal heart size. No pericardial effusion. Age advanced calcific atherosclerotic disease of the coronary arteries Mediastinum/Nodes: No enlarged mediastinal, hilar, or axillary lymph nodes. Thyroid gland, trachea, and esophagus demonstrate no significant findings. Lungs/Pleura: Focal airspace consolidation versus atelectasis in the right lower lobe. Milder atelectatic changes versus airspace consolidation in the right middle lobe and lingula. No significant pleural effusion or pneumothorax. Musculoskeletal: No chest wall abnormality. No acute or significant osseous findings. Review of the MIP images confirms the above findings. CT ABDOMEN and PELVIS FINDINGS Hepatobiliary: Normal contour and attenuation of the liver. The gallbladder is  decompressed and therefore poorly evaluated. Small amount of free fluid tracks in the perihepatic space and in the gallbladder fossa. Pancreas: Unremarkable. No pancreatic ductal dilatation or surrounding inflammatory changes. Spleen: Normal in size without focal abnormality. Adrenals/Urinary Tract: Adrenal glands are unremarkable. Kidneys are without focal lesion, or hydronephrosis. 3 mm nonobstructive calculus in the right renal pelvis. Bladder is unremarkable. Stomach/Bowel: Stomach is within normal limits. Appendix appears normal. No evidence of bowel wall thickening, distention, or inflammatory changes. Vascular/Lymphatic: Aortic atherosclerosis. No enlarged abdominal or pelvic lymph nodes. Reproductive: Prostate is unremarkable. Other: No abdominal wall hernia or abnormality. Musculoskeletal: No acute or significant osseous findings. Diffuse abdominal wall subcutaneous edema. Review of the MIP images confirms the above findings. IMPRESSION: 1. No evidence of pulmonary embolus. 2. Age advanced calcific atherosclerotic disease of the coronary arteries. 3. Focal airspace consolidation versus atelectasis in the right lower lobe. Milder atelectatic changes versus airspace consolidation in the right middle lobe and lingula. 4. Small amount of perihepatic ascites, which also tract within the gallbladder fossa. The gallbladder is poorly evaluated due to its collapsed state. 5. 3 mm nonobstructive calculus in the right renal pelvis. 6. Diffuse abdominal wall subcutaneous edema. Electronically Signed   By: Fidela Salisbury M.D.   On: 03/31/2019 14:52   Ct Abdomen Pelvis W Contrast  Result Date: 03/31/2019 CLINICAL DATA:  Shortness of breath. EXAM: CT ANGIOGRAPHY CHEST CT ABDOMEN AND PELVIS WITH CONTRAST TECHNIQUE: Multidetector CT imaging of the chest was performed using the standard protocol during bolus administration of intravenous contrast. Multiplanar CT image reconstructions and MIPs were obtained to  evaluate the vascular anatomy. Multidetector CT imaging of the abdomen and pelvis was performed using the standard protocol during bolus administration of intravenous contrast. CONTRAST:  149mL OMNIPAQUE IOHEXOL 350 MG/ML SOLN COMPARISON:  None. FINDINGS: CTA CHEST FINDINGS Cardiovascular: Satisfactory opacification of the pulmonary arteries to the segmental level. No evidence of pulmonary embolism. Normal heart size. No pericardial effusion. Age advanced calcific atherosclerotic disease of the coronary arteries Mediastinum/Nodes: No enlarged mediastinal, hilar, or axillary lymph nodes. Thyroid gland, trachea, and esophagus demonstrate no significant findings. Lungs/Pleura: Focal airspace consolidation versus atelectasis in the right lower lobe. Milder atelectatic changes versus airspace consolidation in the right middle lobe and lingula. No significant pleural effusion or pneumothorax. Musculoskeletal: No chest wall abnormality. No acute or significant osseous findings. Review of the MIP images confirms the above findings. CT ABDOMEN and PELVIS FINDINGS Hepatobiliary: Normal contour and attenuation of the liver. The gallbladder is decompressed and therefore poorly evaluated. Small amount of free fluid tracks in the perihepatic space and in the gallbladder fossa. Pancreas: Unremarkable. No pancreatic  ductal dilatation or surrounding inflammatory changes. Spleen: Normal in size without focal abnormality. Adrenals/Urinary Tract: Adrenal glands are unremarkable. Kidneys are without focal lesion, or hydronephrosis. 3 mm nonobstructive calculus in the right renal pelvis. Bladder is unremarkable. Stomach/Bowel: Stomach is within normal limits. Appendix appears normal. No evidence of bowel wall thickening, distention, or inflammatory changes. Vascular/Lymphatic: Aortic atherosclerosis. No enlarged abdominal or pelvic lymph nodes. Reproductive: Prostate is unremarkable. Other: No abdominal wall hernia or abnormality.  Musculoskeletal: No acute or significant osseous findings. Diffuse abdominal wall subcutaneous edema. Review of the MIP images confirms the above findings. IMPRESSION: 1. No evidence of pulmonary embolus. 2. Age advanced calcific atherosclerotic disease of the coronary arteries. 3. Focal airspace consolidation versus atelectasis in the right lower lobe. Milder atelectatic changes versus airspace consolidation in the right middle lobe and lingula. 4. Small amount of perihepatic ascites, which also tract within the gallbladder fossa. The gallbladder is poorly evaluated due to its collapsed state. 5. 3 mm nonobstructive calculus in the right renal pelvis. 6. Diffuse abdominal wall subcutaneous edema. Electronically Signed   By: Ted Mcalpine M.D.   On: 03/31/2019 14:52   Dg Chest Portable 1 View  Result Date: 03/31/2019 CLINICAL DATA:  Shortness of breath. Lower abdominal pain. EXAM: PORTABLE CHEST 1 VIEW COMPARISON:  10/10/2018. FINDINGS: Breathing motion blurring. Grossly stable enlarged cardiac silhouette, pulmonary vascular congestion and minimal prominent interstitial markings. Interval right basilar airspace opacity. Stable linear scarring at the left lung base. The visualized bones are unremarkable. IMPRESSION: 1. Interval right basilar atelectasis or pneumonia. 2. Stable cardiomegaly, pulmonary vascular congestion and minimal chronic interstitial lung disease. Electronically Signed   By: Beckie Salts M.D.   On: 03/31/2019 13:02        Scheduled Meds:  amLODipine  10 mg Oral Daily   aspirin EC  81 mg Oral Daily   atorvastatin  10 mg Oral Daily   budesonide (PULMICORT) nebulizer solution  0.25 mg Nebulization BID   enoxaparin (LOVENOX) injection  90 mg Subcutaneous Q24H   furosemide  40 mg Intravenous Q12H   ipratropium  0.5 mg Nebulization Q6H   levalbuterol  0.63 mg Nebulization Q6H   methylPREDNISolone (SOLU-MEDROL) injection  80 mg Intravenous Q8H   potassium chloride  20  mEq Oral BID   Continuous Infusions:  azithromycin Stopped (04/01/19 0302)   cefTRIAXone (ROCEPHIN)  IV Stopped (04/01/19 0052)     LOS: 1 day    Time spent: 30 minutes    Aubriee Szeto Hoover Brunette, DO [facility] Pager [phone]  If 7PM-7AM, please contact night-coverage [email or web address] Password [email or web address] 04/01/2019, 10:00 AM

## 2019-04-01 NOTE — Discharge Summary (Signed)
Physician Discharge Summary  Arvella NighClaude Dowda ZOX:[id number] DOB: [birth year] DOA: 03/31/2019  PCP: Elfredia NevinsFusco, Lawrence, MD  Admit date: 03/31/2019  Discharge date: 04/01/2019  Admitted From:Home  Disposition:  AMA DISCHARGE  Brief/Interim Summary: Per HPI: Celene KrasClaude Kelly a 58 y.o.malewith a history of obesity, chronic pain, hyperlipidemia, hypertension. Patient presents with abdominal pain x2 weeks in the right lower quadrant. Began after lifting lawnmower into a truck. Gradually worsening. Pain worse with palpation and movement, but is able to eat and drink without any problems. No fevers, chills, nausea, vomiting. In addition, the patient began to be severely hypoxic and short of breath this evening on arrival in the emergency department, his oxygen saturation was less than 85% on room air. He was placed on BiPAP due to hypersomnolence with gradual improvement. ABGs were done showing severe hypercarbia.  Patient was admitted with acute combined respiratory failure with hypoxemia and hypercarbia in the setting of community-acquired pneumonia as well as CHF exacerbation.  He has been placed on IV diuresis as well as Rocephin and azithromycin and continues to require BiPAP with high PCO2 levels noted.  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  He is doing well with his treatments as noted with IV Lasix as well as antibiotics and steroids that were added.  He was placed on nasal cannula for lunch and spoke to his son about [facility].  He stated that he did not have any trouble with his care here, but would simply prefer to go to that facility for further care.  He seems to think that he would get better care at a larger facility and his son agrees.  He is otherwise alert and oriented to person, place, and time and understands the risk that he is undertaking given his acute respiratory decompensation.  He has full medical capacity to make this decision.     He is noted to desaturate into the mid 80th percentile without any nasal cannula oxygen, but is otherwise coherent and states that his son will come to pick him up immediately and take him straight to [facility] emergency department at [facility].  I have spoken with his son on the phone as well who understands the risks of transporting his father in this condition and are agreeable to leaving AGAINST MEDICAL ADVICE.  He has now signed the documentation and will be discharged.   Discharge Diagnoses:  Principal Problem:   Acute respiratory failure with hypoxia and hypercapnia (HCC) Active Problems:   CAP (community acquired pneumonia)   Elevated serum creatinine   Acute CHF (congestive heart failure) (HCC)   Obesity, morbid, BMI 50 or higher (HCC)   Abdominal pain   Hypertension     No Known Allergies  Consultations:  Pulmonology   Procedures/Studies: Ct Angio Chest Pe W/cm &/or Wo Cm  Result Date: 03/31/2019 CLINICAL DATA:  Shortness of breath. EXAM: CT ANGIOGRAPHY CHEST CT ABDOMEN AND PELVIS WITH CONTRAST TECHNIQUE: Multidetector CT imaging of the chest was performed using the standard protocol during bolus administration of intravenous contrast. Multiplanar CT image reconstructions and MIPs were obtained to evaluate the vascular anatomy. Multidetector CT imaging of the abdomen and pelvis was performed using the standard protocol during bolus administration of intravenous contrast. CONTRAST:  100mL OMNIPAQUE IOHEXOL 350 MG/ML SOLN COMPARISON:  None. FINDINGS: CTA CHEST FINDINGS Cardiovascular: Satisfactory opacification of the pulmonary arteries to the segmental level. No evidence of pulmonary embolism. Normal heart size. No pericardial effusion. Age advanced calcific atherosclerotic  disease of the coronary arteries Mediastinum/Nodes: No enlarged mediastinal, hilar, or axillary lymph nodes. Thyroid gland, trachea, and esophagus demonstrate no significant findings. Lungs/Pleura:  Focal airspace consolidation versus atelectasis in the right lower lobe. Milder atelectatic changes versus airspace consolidation in the right middle lobe and lingula. No significant pleural effusion or pneumothorax. Musculoskeletal: No chest wall abnormality. No acute or significant osseous findings. Review of the MIP images confirms the above findings. CT ABDOMEN and PELVIS FINDINGS Hepatobiliary: Normal contour and attenuation of the liver. The gallbladder is decompressed and therefore poorly evaluated. Small amount of free fluid tracks in the perihepatic space and in the gallbladder fossa. Pancreas: Unremarkable. No pancreatic ductal dilatation or surrounding inflammatory changes. Spleen: Normal in size without focal abnormality. Adrenals/Urinary Tract: Adrenal glands are unremarkable. Kidneys are without focal lesion, or hydronephrosis. 3 mm nonobstructive calculus in the right renal pelvis. Bladder is unremarkable. Stomach/Bowel: Stomach is within normal limits. Appendix appears normal. No evidence of bowel wall thickening, distention, or inflammatory changes. Vascular/Lymphatic: Aortic atherosclerosis. No enlarged abdominal or pelvic lymph nodes. Reproductive: Prostate is unremarkable. Other: No abdominal wall hernia or abnormality. Musculoskeletal: No acute or significant osseous findings. Diffuse abdominal wall subcutaneous edema. Review of the MIP images confirms the above findings. IMPRESSION: 1. No evidence of pulmonary embolus. 2. Age advanced calcific atherosclerotic disease of the coronary arteries. 3. Focal airspace consolidation versus atelectasis in the right lower lobe. Milder atelectatic changes versus airspace consolidation in the right middle lobe and lingula. 4. Small amount of perihepatic ascites, which also tract within the gallbladder fossa. The gallbladder is poorly evaluated due to its collapsed state. 5. 3 mm nonobstructive calculus in the right renal pelvis. 6. Diffuse abdominal wall  subcutaneous edema. Electronically Signed   By: Ted Mcalpineobrinka  Dimitrova M.D.   On: 03/31/2019 14:52   Ct Abdomen Pelvis W Contrast  Result Date: 03/31/2019 CLINICAL DATA:  Shortness of breath. EXAM: CT ANGIOGRAPHY CHEST CT ABDOMEN AND PELVIS WITH CONTRAST TECHNIQUE: Multidetector CT imaging of the chest was performed using the standard protocol during bolus administration of intravenous contrast. Multiplanar CT image reconstructions and MIPs were obtained to evaluate the vascular anatomy. Multidetector CT imaging of the abdomen and pelvis was performed using the standard protocol during bolus administration of intravenous contrast. CONTRAST:  100mL OMNIPAQUE IOHEXOL 350 MG/ML SOLN COMPARISON:  None. FINDINGS: CTA CHEST FINDINGS Cardiovascular: Satisfactory opacification of the pulmonary arteries to the segmental level. No evidence of pulmonary embolism. Normal heart size. No pericardial effusion. Age advanced calcific atherosclerotic disease of the coronary arteries Mediastinum/Nodes: No enlarged mediastinal, hilar, or axillary lymph nodes. Thyroid gland, trachea, and esophagus demonstrate no significant findings. Lungs/Pleura: Focal airspace consolidation versus atelectasis in the right lower lobe. Milder atelectatic changes versus airspace consolidation in the right middle lobe and lingula. No significant pleural effusion or pneumothorax. Musculoskeletal: No chest wall abnormality. No acute or significant osseous findings. Review of the MIP images confirms the above findings. CT ABDOMEN and PELVIS FINDINGS Hepatobiliary: Normal contour and attenuation of the liver. The gallbladder is decompressed and therefore poorly evaluated. Small amount of free fluid tracks in the perihepatic space and in the gallbladder fossa. Pancreas: Unremarkable. No pancreatic ductal dilatation or surrounding inflammatory changes. Spleen: Normal in size without focal abnormality. Adrenals/Urinary Tract: Adrenal glands are unremarkable.  Kidneys are without focal lesion, or hydronephrosis. 3 mm nonobstructive calculus in the right renal pelvis. Bladder is unremarkable. Stomach/Bowel: Stomach is within normal limits. Appendix appears normal. No evidence of bowel wall thickening, distention, or inflammatory changes. Vascular/Lymphatic:  Aortic atherosclerosis. No enlarged abdominal or pelvic lymph nodes. Reproductive: Prostate is unremarkable. Other: No abdominal wall hernia or abnormality. Musculoskeletal: No acute or significant osseous findings. Diffuse abdominal wall subcutaneous edema. Review of the MIP images confirms the above findings. IMPRESSION: 1. No evidence of pulmonary embolus. 2. Age advanced calcific atherosclerotic disease of the coronary arteries. 3. Focal airspace consolidation versus atelectasis in the right lower lobe. Milder atelectatic changes versus airspace consolidation in the right middle lobe and lingula. 4. Small amount of perihepatic ascites, which also tract within the gallbladder fossa. The gallbladder is poorly evaluated due to its collapsed state. 5. 3 mm nonobstructive calculus in the right renal pelvis. 6. Diffuse abdominal wall subcutaneous edema. Electronically Signed   By: Ted Mcalpine M.D.   On: 03/31/2019 14:52   Dg Chest Portable 1 View  Result Date: 03/31/2019 CLINICAL DATA:  Shortness of breath. Lower abdominal pain. EXAM: PORTABLE CHEST 1 VIEW COMPARISON:  10/10/2018. FINDINGS: Breathing motion blurring. Grossly stable enlarged cardiac silhouette, pulmonary vascular congestion and minimal prominent interstitial markings. Interval right basilar airspace opacity. Stable linear scarring at the left lung base. The visualized bones are unremarkable. IMPRESSION: 1. Interval right basilar atelectasis or pneumonia. 2. Stable cardiomegaly, pulmonary vascular congestion and minimal chronic interstitial lung disease. Electronically Signed   By: Beckie Salts M.D.   On: 03/31/2019 13:02      Discharge  Exam: Vitals:   04/01/19 1200 04/01/19 1300  BP: 116/87 (!) 124/99  Pulse: (!) 128 (!) 126  Resp: (!) 21 (!) 26  Temp:    SpO2: 94% 92%   Vitals:   04/01/19 1100 04/01/19 1131 04/01/19 1200 04/01/19 1300  BP: (!) 135/104  116/87 (!) 124/99  Pulse: (!) 127 90 (!) 128 (!) 126  Resp: 17 20 (!) 21 (!) 26  Temp:  99.5 F (37.5 C)    TempSrc:  Oral    SpO2: 97% 93% 94% 92%  Weight:      Height:        General: Pt is alert, awake, not in acute distress, obese Cardiovascular: RRR, S1/S2 +, no rubs, no gallops Respiratory: CTA bilaterally, no wheezing, no rhonchi Abdominal: Soft, NT, ND, bowel sounds + Extremities: no edema, no cyanosis    The results of significant diagnostics from this hospitalization (including imaging, microbiology, ancillary and laboratory) are listed below for reference.     Microbiology: Recent Results (from the past 240 hour(s))  SARS Coronavirus 2 (CEPHEID- Performed in [facility] hospital lab), Hosp Order     Status: None   Collection Time: 03/31/19 12:54 PM   Specimen: Nasopharyngeal Swab  Result Value Ref Range Status   SARS Coronavirus 2 NEGATIVE NEGATIVE Final    Comment: (NOTE) If result is NEGATIVE SARS-CoV-2 target nucleic acids are NOT DETECTED. The SARS-CoV-2 RNA is generally detectable in upper and lower  respiratory specimens during the acute phase of infection. The lowest  concentration of SARS-CoV-2 viral copies this assay can detect is 250  copies / mL. A negative result does not preclude SARS-CoV-2 infection  and should not be used as the sole basis for treatment or other  patient management decisions.  A negative result may occur with  improper specimen collection / handling, submission of specimen other  than nasopharyngeal swab, presence of viral mutation(s) within the  areas targeted by this assay, and inadequate number of viral copies  (<250 copies / mL). A negative result must be combined with clinical  observations, patient  history, and epidemiological  information. If result is POSITIVE SARS-CoV-2 target nucleic acids are DETECTED. The SARS-CoV-2 RNA is generally detectable in upper and lower  respiratory specimens dur ing the acute phase of infection.  Positive  results are indicative of active infection with SARS-CoV-2.  Clinical  correlation with patient history and other diagnostic information is  necessary to determine patient infection status.  Positive results do  not rule out bacterial infection or co-infection with other viruses. If result is PRESUMPTIVE POSTIVE SARS-CoV-2 nucleic acids MAY BE PRESENT.   A presumptive positive result was obtained on the submitted specimen  and confirmed on repeat testing.  While 2019 novel coronavirus  (SARS-CoV-2) nucleic acids may be present in the submitted sample  additional confirmatory testing may be necessary for epidemiological  and / or clinical management purposes  to differentiate between  SARS-CoV-2 and other Sarbecovirus currently known to infect humans.  If clinically indicated additional testing with an alternate test  methodology [phone]) is advised. The SARS-CoV-2 RNA is generally  detectable in upper and lower respiratory sp ecimens during the acute  phase of infection. The expected result is Negative. Fact Sheet for Patients:  StrictlyIdeas.no Fact Sheet for Healthcare Providers: BankingDealers.co.za This test is not yet approved or cleared by the [place] FDA and has been authorized for detection and/or diagnosis of SARS-CoV-2 by FDA under an Emergency Use Authorization (EUA).  This EUA will remain in effect (meaning this test can be used) for the duration of the COVID-19 declaration under Section 564(b)(1) of the Act, 21 U.S.C. section 360bbb-3(b)(1), unless the authorization is terminated or revoked sooner. Performed at [facility], [address] [place], [place] [address]   MRSA PCR  Screening     Status: None   Collection Time: 03/31/19  9:53 PM   Specimen: Nasal Mucosa; Nasopharyngeal  Result Value Ref Range Status   MRSA by PCR NEGATIVE NEGATIVE Final    Comment:        The GeneXpert MRSA Assay (FDA approved for NASAL specimens only), is one component of a comprehensive MRSA colonization surveillance program. It is not intended to diagnose MRSA infection nor to guide or monitor treatment for MRSA infections. Performed at [facility], [address] [place], [place] [address]      Labs: BNP (last 3 results) Recent Labs    03/31/19 1251  BNP 308.6*   Basic Metabolic Panel: Recent Labs  Lab 03/31/19 1250 04/01/19 0442  NA 146* 141  K 4.7 5.1  CL 105 103  CO2 31 30  GLUCOSE 159* 114*  BUN 41* 33*  CREATININE 1.47* 1.17  CALCIUM 8.3* 8.2*   Liver Function Tests: Recent Labs  Lab 03/31/19 1250  AST 20  ALT 28  ALKPHOS 99  BILITOT 0.6  PROT 6.6  ALBUMIN 3.4*   Recent Labs  Lab 03/31/19 1250  LIPASE 28   No results for input(s): AMMONIA in the last 168 hours. CBC: Recent Labs  Lab 03/31/19 1250 04/01/19 0442  WBC 7.5 8.5  NEUTROABS 4.9  --   HGB 14.9 14.7  HCT 49.6 49.7  MCV 113.8* 116.1*  PLT 207 198   Cardiac Enzymes: No results for input(s): CKTOTAL, CKMB, CKMBINDEX, TROPONINI in the last 168 hours. BNP: Invalid input(s): POCBNP CBG: No results for input(s): GLUCAP in the last 168 hours. D-Dimer No results for input(s): DDIMER in the last 72 hours. Hgb A1c No results for input(s): HGBA1C in the last 72 hours. Lipid Profile No results for input(s): CHOL, HDL, LDLCALC, TRIG,  CHOLHDL, LDLDIRECT in the last 72 hours. Thyroid function studies No results for input(s): TSH, T4TOTAL, T3FREE, THYROIDAB in the last 72 hours.  Invalid input(s): FREET3 Anemia work up No results for input(s): VITAMINB12, FOLATE, FERRITIN, TIBC, IRON, RETICCTPCT in the last 72 hours. Urinalysis    Component Value Date/Time   COLORURINE  YELLOW 03/31/2019 1630   APPEARANCEUR CLEAR 03/31/2019 1630   LABSPEC 1.025 03/31/2019 1630   PHURINE 5.0 03/31/2019 1630   GLUCOSEU NEGATIVE 03/31/2019 1630   HGBUR LARGE (A) 03/31/2019 1630   BILIRUBINUR NEGATIVE 03/31/2019 1630   KETONESUR NEGATIVE 03/31/2019 1630   PROTEINUR 30 (A) 03/31/2019 1630   NITRITE NEGATIVE 03/31/2019 1630   LEUKOCYTESUR NEGATIVE 03/31/2019 1630   Sepsis Labs Invalid input(s): PROCALCITONIN,  WBC,  LACTICIDVEN Microbiology Recent Results (from the past 240 hour(s))  SARS Coronavirus 2 (CEPHEID- Performed in [facility] hospital lab), Hosp Order     Status: None   Collection Time: 03/31/19 12:54 PM   Specimen: Nasopharyngeal Swab  Result Value Ref Range Status   SARS Coronavirus 2 NEGATIVE NEGATIVE Final    Comment: (NOTE) If result is NEGATIVE SARS-CoV-2 target nucleic acids are NOT DETECTED. The SARS-CoV-2 RNA is generally detectable in upper and lower  respiratory specimens during the acute phase of infection. The lowest  concentration of SARS-CoV-2 viral copies this assay can detect is 250  copies / mL. A negative result does not preclude SARS-CoV-2 infection  and should not be used as the sole basis for treatment or other  patient management decisions.  A negative result may occur with  improper specimen collection / handling, submission of specimen other  than nasopharyngeal swab, presence of viral mutation(s) within the  areas targeted by this assay, and inadequate number of viral copies  (<250 copies / mL). A negative result must be combined with clinical  observations, patient history, and epidemiological information. If result is POSITIVE SARS-CoV-2 target nucleic acids are DETECTED. The SARS-CoV-2 RNA is generally detectable in upper and lower  respiratory specimens dur ing the acute phase of infection.  Positive  results are indicative of active infection with SARS-CoV-2.  Clinical  correlation with patient history and other diagnostic  information is  necessary to determine patient infection status.  Positive results do  not rule out bacterial infection or co-infection with other viruses. If result is PRESUMPTIVE POSTIVE SARS-CoV-2 nucleic acids MAY BE PRESENT.   A presumptive positive result was obtained on the submitted specimen  and confirmed on repeat testing.  While 2019 novel coronavirus  (SARS-CoV-2) nucleic acids may be present in the submitted sample  additional confirmatory testing may be necessary for epidemiological  and / or clinical management purposes  to differentiate between  SARS-CoV-2 and other Sarbecovirus currently known to infect humans.  If clinically indicated additional testing with an alternate test  methodology [phone]([id number]) is advised. The SARS-CoV-2 RNA is generally  detectable in upper and lower respiratory sp ecimens during the acute  phase of infection. The expected result is Negative. Fact Sheet for Patients:  BoilerBrush.com.cyhttps://www.fda.gov/media/136312/download Fact Sheet for Healthcare Providers: https://pope.com/https://www.fda.gov/media/136313/download This test is not yet approved or cleared by the [place] FDA and has been authorized for detection and/or diagnosis of SARS-CoV-2 by FDA under an Emergency Use Authorization (EUA).  This EUA will remain in effect (meaning this test can be used) for the duration of the COVID-19 declaration under Section 564(b)(1) of the Act, 21 U.S.C. section 360bbb-3(b)(1), unless the authorization is terminated or revoked sooner. Performed at [facility], [address], [place], [place] [address]   MRSA PCR Screening     Status: None   Collection Time: 03/31/19  9:53 PM   Specimen: Nasal Mucosa; Nasopharyngeal  Result Value Ref Range Status   MRSA by PCR NEGATIVE NEGATIVE Final    Comment:        The GeneXpert MRSA Assay (FDA approved for NASAL specimens only), is one component of a comprehensive MRSA colonization surveillance program. It is not intended to  diagnose MRSA infection nor to guide or monitor treatment for MRSA infections. Performed at [facility], [address], [place], [place] [address]      Time coordinating discharge: 35 minutes  SIGNED:   Erick Blinks, DO [facility] 04/01/2019, 1:56 PM  If 7PM-7AM, please contact night-coverage [email or web address] Password [email or web address]

## 2019-04-01 NOTE — Discharge Instructions (Signed)
Left AMA.

## 2019-04-01 NOTE — Consult Note (Signed)
Consult requested by: [facility] hospitalist, Dr. Manuella Ghazi Consult requested for: Respiratory failure  HPI: This is a 58 year old who came to the emergency department because of abdominal pain and shortness of breath.  He is known to have obesity chronic pain hyperlipidemia and hypertension.  He says he has been having increasing shortness of breath at home.  He says he is not able to manage activities of daily living and is worried that is not going to be able to return to work.  Because of his chest pain he had CT angiogram which I have personally reviewed and that does show consolidation in the right lower and right middle lung.  He also has some ascites.  Coronavirus is negative.  He is not coughing anything up.  He was placed on BiPAP because of severe hypercapnia and although he has been on BiPAP pretty much overnight his blood gas has not improved.  He was sleeping when I went in but he does arouse and is able to hold a conversation although it is difficult to understand because of the BiPAP.  Past Medical History:  Diagnosis Date  . Anxiety   . Chronic back pain 2011  . History of fracture of clavicle    Right  . Hyperlipidemia   . Hypertension   . Osteoarthritis      Family History  Problem Relation Age of Onset  . Ovarian cancer Mother   . Diabetes Father      Social History   Socioeconomic History  . Marital status: Divorced    Spouse name: Not on file  . Number of children: Not on file  . Years of education: Not on file  . Highest education level: Not on file  Occupational History  . Not on file  Social Needs  . Financial resource strain: Not on file  . Food insecurity    Worry: Not on file    Inability: Not on file  . Transportation needs    Medical: Not on file    Non-medical: Not on file  Tobacco Use  . Smoking status: Current Every Day Smoker    Packs/day: 0.50    Years: 25.00    Pack years: 12.50    Types: Cigarettes  . Smokeless tobacco: Never Used  Substance  and Sexual Activity  . Alcohol use: Yes    Comment: Occasional  . Drug use: No  . Sexual activity: Not on file  Lifestyle  . Physical activity    Days per week: Not on file    Minutes per session: Not on file  . Stress: Not on file  Relationships  . Social Herbalist on phone: Not on file    Gets together: Not on file    Attends religious service: Not on file    Active member of club or organization: Not on file    Attends meetings of clubs or organizations: Not on file    Relationship status: Not on file  Other Topics Concern  . Not on file  Social History Narrative  . Not on file     ROS: He is not had any nausea or vomiting.  He is not had any fever or chills.  No urinary symptoms.  Is not totally clear about the rest of his review of systems because he is difficult to understand with the BiPAP on    Objective: Vital signs in last 24 hours: Temp:  [97.9 F (36.6 C)-98.1 F (36.7 C)] 98.1 F (36.7 C) (06/20  [id number]) Pulse Rate:  [77-128] 128 (06/20 0742) Resp:  [16-35] 16 (06/20 0742) BP: (88-120)/(46-87) 110/76 (06/20 0700) SpO2:  [82 %-100 %] 93 % (06/20 0742) FiO2 (%):  [40 %] 40 % (06/20 0838) Weight:  [179 kg-179.6 kg] 179.6 kg (06/19 2200) Weight change:     Intake/Output from previous day: 06/19 0701 - 06/20 0700 In: 601.6 [IV Piggyback:601.6] Out: 1150 [Urine:1150]  PHYSICAL EXAM Constitutional: He is morbidly obese.  He is on BiPAP.  Eyes: Pupils react EOMI.  Ears nose mouth and throat: Mucous membranes are moist.  Hearing is grossly normal.  Exam limited by BiPAP.  Cardiovascular: His heart is regular with normal heart sounds.  Respiratory: His respiratory effort is increased.  He has rhonchi bilaterally.  Gastrointestinal: His abdomen is soft obese without masses.  Musculoskeletal: Grossly normal strength upper and lower extremities bilaterally.  Neurological: No focal abnormalities.  Psychiatric: He seems anxious  Lab Results: Basic Metabolic  Panel: Recent Labs    03/31/19 1250 04/01/19 0442  NA 146* 141  K 4.7 5.1  CL 105 103  CO2 31 30  GLUCOSE 159* 114*  BUN 41* 33*  CREATININE 1.47* 1.17  CALCIUM 8.3* 8.2*   Liver Function Tests: Recent Labs    03/31/19 1250  AST 20  ALT 28  ALKPHOS 99  BILITOT 0.6  PROT 6.6  ALBUMIN 3.4*   Recent Labs    03/31/19 1250  LIPASE 28   No results for input(s): AMMONIA in the last 72 hours. CBC: Recent Labs    03/31/19 1250 04/01/19 0442  WBC 7.5 8.5  NEUTROABS 4.9  --   HGB 14.9 14.7  HCT 49.6 49.7  MCV 113.8* 116.1*  PLT 207 198   Cardiac Enzymes: No results for input(s): CKTOTAL, CKMB, CKMBINDEX, TROPONINI in the last 72 hours. BNP: No results for input(s): PROBNP in the last 72 hours. D-Dimer: No results for input(s): DDIMER in the last 72 hours. CBG: No results for input(s): GLUCAP in the last 72 hours. Hemoglobin A1C: No results for input(s): HGBA1C in the last 72 hours. Fasting Lipid Panel: No results for input(s): CHOL, HDL, LDLCALC, TRIG, CHOLHDL, LDLDIRECT in the last 72 hours. Thyroid Function Tests: No results for input(s): TSH, T4TOTAL, FREET4, T3FREE, THYROIDAB in the last 72 hours. Anemia Panel: No results for input(s): VITAMINB12, FOLATE, FERRITIN, TIBC, IRON, RETICCTPCT in the last 72 hours. Coagulation: No results for input(s): LABPROT, INR in the last 72 hours. Urine Drug Screen: Drugs of Abuse  No results found for: LABOPIA, COCAINSCRNUR, LABBENZ, AMPHETMU, THCU, LABBARB  Alcohol Level: No results for input(s): ETH in the last 72 hours. Urinalysis: Recent Labs    03/31/19 1630  COLORURINE YELLOW  LABSPEC 1.025  PHURINE 5.0  GLUCOSEU NEGATIVE  HGBUR LARGE*  BILIRUBINUR NEGATIVE  KETONESUR NEGATIVE  PROTEINUR 30*  NITRITE NEGATIVE  LEUKOCYTESUR NEGATIVE   Misc. Labs:   ABGS: Recent Labs    04/01/19 0355  PHART 7.203*  PO2ART 92.0  HCO3 26.5     MICROBIOLOGY: Recent Results (from the past 240 hour(s))  SARS  Coronavirus 2 (CEPHEID- Performed [facility]), Hosp Order     Status: None   Collection Time: 03/31/19 12:54 PM   Specimen: Nasopharyngeal Swab  Result Value Ref Range Status   SARS Coronavirus 2 NEGATIVE NEGATIVE Final    Comment: (NOTE) If result is NEGATIVE SARS-CoV-2 target nucleic acids are NOT DETECTED. The SARS-CoV-2 RNA is generally detectable in upper and lower  respiratory specimens during  the acute phase of infection. The lowest  concentration of SARS-CoV-2 viral copies this assay can detect is 250  copies / mL. A negative result does not preclude SARS-CoV-2 infection  and should not be used as the sole basis for treatment or other  patient management decisions.  A negative result may occur with  improper specimen collection / handling, submission of specimen other  than nasopharyngeal swab, presence of viral mutation(s) within the  areas targeted by this assay, and inadequate number of viral copies  (<250 copies / mL). A negative result must be combined with clinical  observations, patient history, and epidemiological information. If result is POSITIVE SARS-CoV-2 target nucleic acids are DETECTED. The SARS-CoV-2 RNA is generally detectable in upper and lower  respiratory specimens dur ing the acute phase of infection.  Positive  results are indicative of active infection with SARS-CoV-2.  Clinical  correlation with patient history and other diagnostic information is  necessary to determine patient infection status.  Positive results do  not rule out bacterial infection or co-infection with other viruses. If result is PRESUMPTIVE POSTIVE SARS-CoV-2 nucleic acids MAY BE PRESENT.   A presumptive positive result was obtained on the submitted specimen  and confirmed on repeat testing.  While 2019 novel coronavirus  (SARS-CoV-2) nucleic acids may be present in the submitted sample  additional confirmatory testing may be necessary for epidemiological  and / or  clinical management purposes  to differentiate between  SARS-CoV-2 and other Sarbecovirus currently known to infect humans.  If clinically indicated additional testing with an alternate test  methodology [phone]([id number]) is advised. The SARS-CoV-2 RNA is generally  detectable in upper and lower respiratory sp ecimens during the acute  phase of infection. The expected result is Negative. Fact Sheet for Patients:  BoilerBrush.com.cyhttps://www.fda.gov/media/136312/download Fact Sheet for Healthcare Providers: https://pope.com/https://www.fda.gov/media/136313/download This test is not yet approved or cleared by the [place] FDA and has been authorized for detection and/or diagnosis of SARS-CoV-2 by FDA under an Emergency Use Authorization (EUA).  This EUA will remain in effect (meaning this test can be used) for the duration of the COVID-19 declaration under Section 564(b)(1) of the Act, 21 U.S.C. section 360bbb-3(b)(1), unless the authorization is terminated or revoked sooner. Performed at [facility], [address] [place], [place] [address]   MRSA PCR Screening     Status: None   Collection Time: 03/31/19  9:53 PM   Specimen: Nasal Mucosa; Nasopharyngeal  Result Value Ref Range Status   MRSA by PCR NEGATIVE NEGATIVE Final    Comment:        The GeneXpert MRSA Assay (FDA approved for NASAL specimens only), is one component of a comprehensive MRSA colonization surveillance program. It is not intended to diagnose MRSA infection nor to guide or monitor treatment for MRSA infections. Performed at [facility], [address] [place], [place] [address]     Studies/Results: Ct Angio Chest Pe W/cm &/or Wo Cm  Result Date: 03/31/2019 CLINICAL DATA:  Shortness of breath. EXAM: CT ANGIOGRAPHY CHEST CT ABDOMEN AND PELVIS WITH CONTRAST TECHNIQUE: Multidetector CT imaging of the chest was performed using the standard protocol during bolus administration of intravenous contrast. Multiplanar CT image reconstructions and MIPs  were obtained to evaluate the vascular anatomy. Multidetector CT imaging of the abdomen and pelvis was performed using the standard protocol during bolus administration of intravenous contrast. CONTRAST:  100mL OMNIPAQUE IOHEXOL 350 MG/ML SOLN COMPARISON:  None. FINDINGS: CTA CHEST FINDINGS Cardiovascular: Satisfactory opacification of the pulmonary arteries to the  segmental level. No evidence of pulmonary embolism. Normal heart size. No pericardial effusion. Age advanced calcific atherosclerotic disease of the coronary arteries Mediastinum/Nodes: No enlarged mediastinal, hilar, or axillary lymph nodes. Thyroid gland, trachea, and esophagus demonstrate no significant findings. Lungs/Pleura: Focal airspace consolidation versus atelectasis in the right lower lobe. Milder atelectatic changes versus airspace consolidation in the right middle lobe and lingula. No significant pleural effusion or pneumothorax. Musculoskeletal: No chest wall abnormality. No acute or significant osseous findings. Review of the MIP images confirms the above findings. CT ABDOMEN and PELVIS FINDINGS Hepatobiliary: Normal contour and attenuation of the liver. The gallbladder is decompressed and therefore poorly evaluated. Small amount of free fluid tracks in the perihepatic space and in the gallbladder fossa. Pancreas: Unremarkable. No pancreatic ductal dilatation or surrounding inflammatory changes. Spleen: Normal in size without focal abnormality. Adrenals/Urinary Tract: Adrenal glands are unremarkable. Kidneys are without focal lesion, or hydronephrosis. 3 mm nonobstructive calculus in the right renal pelvis. Bladder is unremarkable. Stomach/Bowel: Stomach is within normal limits. Appendix appears normal. No evidence of bowel wall thickening, distention, or inflammatory changes. Vascular/Lymphatic: Aortic atherosclerosis. No enlarged abdominal or pelvic lymph nodes. Reproductive: Prostate is unremarkable. Other: No abdominal wall hernia or  abnormality. Musculoskeletal: No acute or significant osseous findings. Diffuse abdominal wall subcutaneous edema. Review of the MIP images confirms the above findings. IMPRESSION: 1. No evidence of pulmonary embolus. 2. Age advanced calcific atherosclerotic disease of the coronary arteries. 3. Focal airspace consolidation versus atelectasis in the right lower lobe. Milder atelectatic changes versus airspace consolidation in the right middle lobe and lingula. 4. Small amount of perihepatic ascites, which also tract within the gallbladder fossa. The gallbladder is poorly evaluated due to its collapsed state. 5. 3 mm nonobstructive calculus in the right renal pelvis. 6. Diffuse abdominal wall subcutaneous edema. Electronically Signed   By: Ted Mcalpine M.D.   On: 03/31/2019 14:52   Ct Abdomen Pelvis W Contrast  Result Date: 03/31/2019 CLINICAL DATA:  Shortness of breath. EXAM: CT ANGIOGRAPHY CHEST CT ABDOMEN AND PELVIS WITH CONTRAST TECHNIQUE: Multidetector CT imaging of the chest was performed using the standard protocol during bolus administration of intravenous contrast. Multiplanar CT image reconstructions and MIPs were obtained to evaluate the vascular anatomy. Multidetector CT imaging of the abdomen and pelvis was performed using the standard protocol during bolus administration of intravenous contrast. CONTRAST:  OMNIPAQUE IOHEXOL 350 MG/ML SOLN COMPARISON:  None. FINDINGS: CTA CHEST FINDINGS Cardiovascular: Satisfactory opacification of the pulmonary arteries to the segmental level. No evidence of pulmonary embolism. Normal heart size. No pericardial effusion. Age advanced calcific atherosclerotic disease of the coronary arteries Mediastinum/Nodes: No enlarged mediastinal, hilar, or axillary lymph nodes. Thyroid gland, trachea, and esophagus demonstrate no significant findings. Lungs/Pleura: Focal airspace consolidation versus atelectasis in the right lower lobe. Milder atelectatic changes  versus airspace consolidation in the right middle lobe and lingula. No significant pleural effusion or pneumothorax. Musculoskeletal: No chest wall abnormality. No acute or significant osseous findings. Review of the MIP images confirms the above findings. CT ABDOMEN and PELVIS FINDINGS Hepatobiliary: Normal contour and attenuation of the liver. The gallbladder is decompressed and therefore poorly evaluated. Small amount of free fluid tracks in the perihepatic space and in the gallbladder fossa. Pancreas: Unremarkable. No pancreatic ductal dilatation or surrounding inflammatory changes. Spleen: Normal in size without focal abnormality. Adrenals/Urinary Tract: Adrenal glands are unremarkable. Kidneys are without focal lesion, or hydronephrosis. 3 mm nonobstructive calculus in the right renal pelvis. Bladder is unremarkable. Stomach/Bowel: Stomach is within  normal limits. Appendix appears normal. No evidence of bowel wall thickening, distention, or inflammatory changes. Vascular/Lymphatic: Aortic atherosclerosis. No enlarged abdominal or pelvic lymph nodes. Reproductive: Prostate is unremarkable. Other: No abdominal wall hernia or abnormality. Musculoskeletal: No acute or significant osseous findings. Diffuse abdominal wall subcutaneous edema. Review of the MIP images confirms the above findings. IMPRESSION: 1. No evidence of pulmonary embolus. 2. Age advanced calcific atherosclerotic disease of the coronary arteries. 3. Focal airspace consolidation versus atelectasis in the right lower lobe. Milder atelectatic changes versus airspace consolidation in the right middle lobe and lingula. 4. Small amount of perihepatic ascites, which also tract within the gallbladder fossa. The gallbladder is poorly evaluated due to its collapsed state. 5. 3 mm nonobstructive calculus in the right renal pelvis. 6. Diffuse abdominal wall subcutaneous edema. Electronically Signed   By: Ted Mcalpine M.D.   On: 03/31/2019 14:52   Dg  Chest Portable 1 View  Result Date: 03/31/2019 CLINICAL DATA:  Shortness of breath. Lower abdominal pain. EXAM: PORTABLE CHEST 1 VIEW COMPARISON:  10/10/2018. FINDINGS: Breathing motion blurring. Grossly stable enlarged cardiac silhouette, pulmonary vascular congestion and minimal prominent interstitial markings. Interval right basilar airspace opacity. Stable linear scarring at the left lung base. The visualized bones are unremarkable. IMPRESSION: 1. Interval right basilar atelectasis or pneumonia. 2. Stable cardiomegaly, pulmonary vascular congestion and minimal chronic interstitial lung disease. Electronically Signed   By: Beckie Salts M.D.   On: 03/31/2019 13:02    Medications:  Prior to Admission:  Medications Prior to Admission  Medication Sig Dispense Refill Last Dose  . ALPRAZolam (XANAX) 1 MG tablet Take 1 mg by mouth every 6 (six) hours as needed for anxiety.   Unknown at Unknown time  . amLODipine (NORVASC) 10 MG tablet Take 10 mg by mouth daily.   Unknown at Unknown time  . aspirin 81 MG tablet Take 81 mg by mouth daily.   Unknown at Unknown time  . atorvastatin (LIPITOR) 10 MG tablet Take 10 mg by mouth daily.   Unknown at Unknown time  . lisinopril (PRINIVIL,ZESTRIL) 40 MG tablet Take 40 mg by mouth daily.   Unknown at Unknown time  . traMADol (ULTRAM) 50 MG tablet Take 50 mg by mouth every 6 (six) hours as needed.   Unknown at Unknown time   Scheduled: . amLODipine  10 mg Oral Daily  . aspirin EC  81 mg Oral Daily  . atorvastatin  10 mg Oral Daily  . budesonide (PULMICORT) nebulizer solution  0.25 mg Nebulization BID  . enoxaparin (LOVENOX) injection  90 mg Subcutaneous Q24H  . furosemide  40 mg Intravenous Q12H  . ipratropium  0.5 mg Nebulization Q6H  . levalbuterol  0.63 mg Nebulization Q6H  . methylPREDNISolone (SOLU-MEDROL) injection  80 mg Intravenous Q8H  . potassium chloride  20 mEq Oral BID   Continuous: . azithromycin Stopped (04/01/19 0302)  . cefTRIAXone  (ROCEPHIN)  IV Stopped (04/01/19 0052)   ZOX:[id number], traMADol  Assesment: He is admitted with acute hypoxic and hypercapnic respiratory failure.  His initial PCO2 was higher but he is still in the 80s.  His pH is down with that so I expect that his baseline PCO2 is probably in the 60s.  Part of this is from community-acquired pneumonia.  He does have some smoking history so may have some element of COPD.  He is short of breath at home but that may be related to his morbid obesity.  He clearly has obesity hypoventilation.  He has heart  failure which is being treated.  He has pneumonia which is being treated Principal Problem:   Acute respiratory failure with hypoxia and hypercapnia (HCC) Active Problems:   CAP (community acquired pneumonia)   Elevated serum creatinine   Acute CHF (congestive heart failure) (HCC)   Obesity, morbid, BMI 50 or higher (HCC)   Abdominal pain   Hypertension    Plan: He clearly needs BiPAP at least at night and he may need it during the day today.  He is however awake and able to hold a conversation.  He is high risk to need to be intubated but I do not think he needs to be intubated now.    LOS: 1 day   Fredirick Maudlindward L Britain Saber 04/01/2019, 9:12 AM

## 2019-04-02 LAB — HIV ANTIBODY (ROUTINE TESTING W REFLEX): HIV Screen 4th Generation wRfx: NONREACTIVE

## 2019-04-25 ENCOUNTER — Inpatient Hospital Stay (HOSPITAL_COMMUNITY)
Admission: EM | Admit: 2019-04-25 | Payer: BC Managed Care – PPO | Source: Other Acute Inpatient Hospital | Admitting: Critical Care Medicine

## 2019-05-13 DEATH — deceased

## 2020-08-16 IMAGING — CR PORTABLE CHEST - 1 VIEW
1 series · 2 of 2 positions shown · non-contrast
Comparison: 10/10/2018.

CLINICAL DATA: Shortness of breath. Lower abdominal pain.

EXAM:
PORTABLE CHEST 1 VIEW

[Series 1: portable · 0.17mm/px · 2 of 2 slices shown]
[im 1/2]
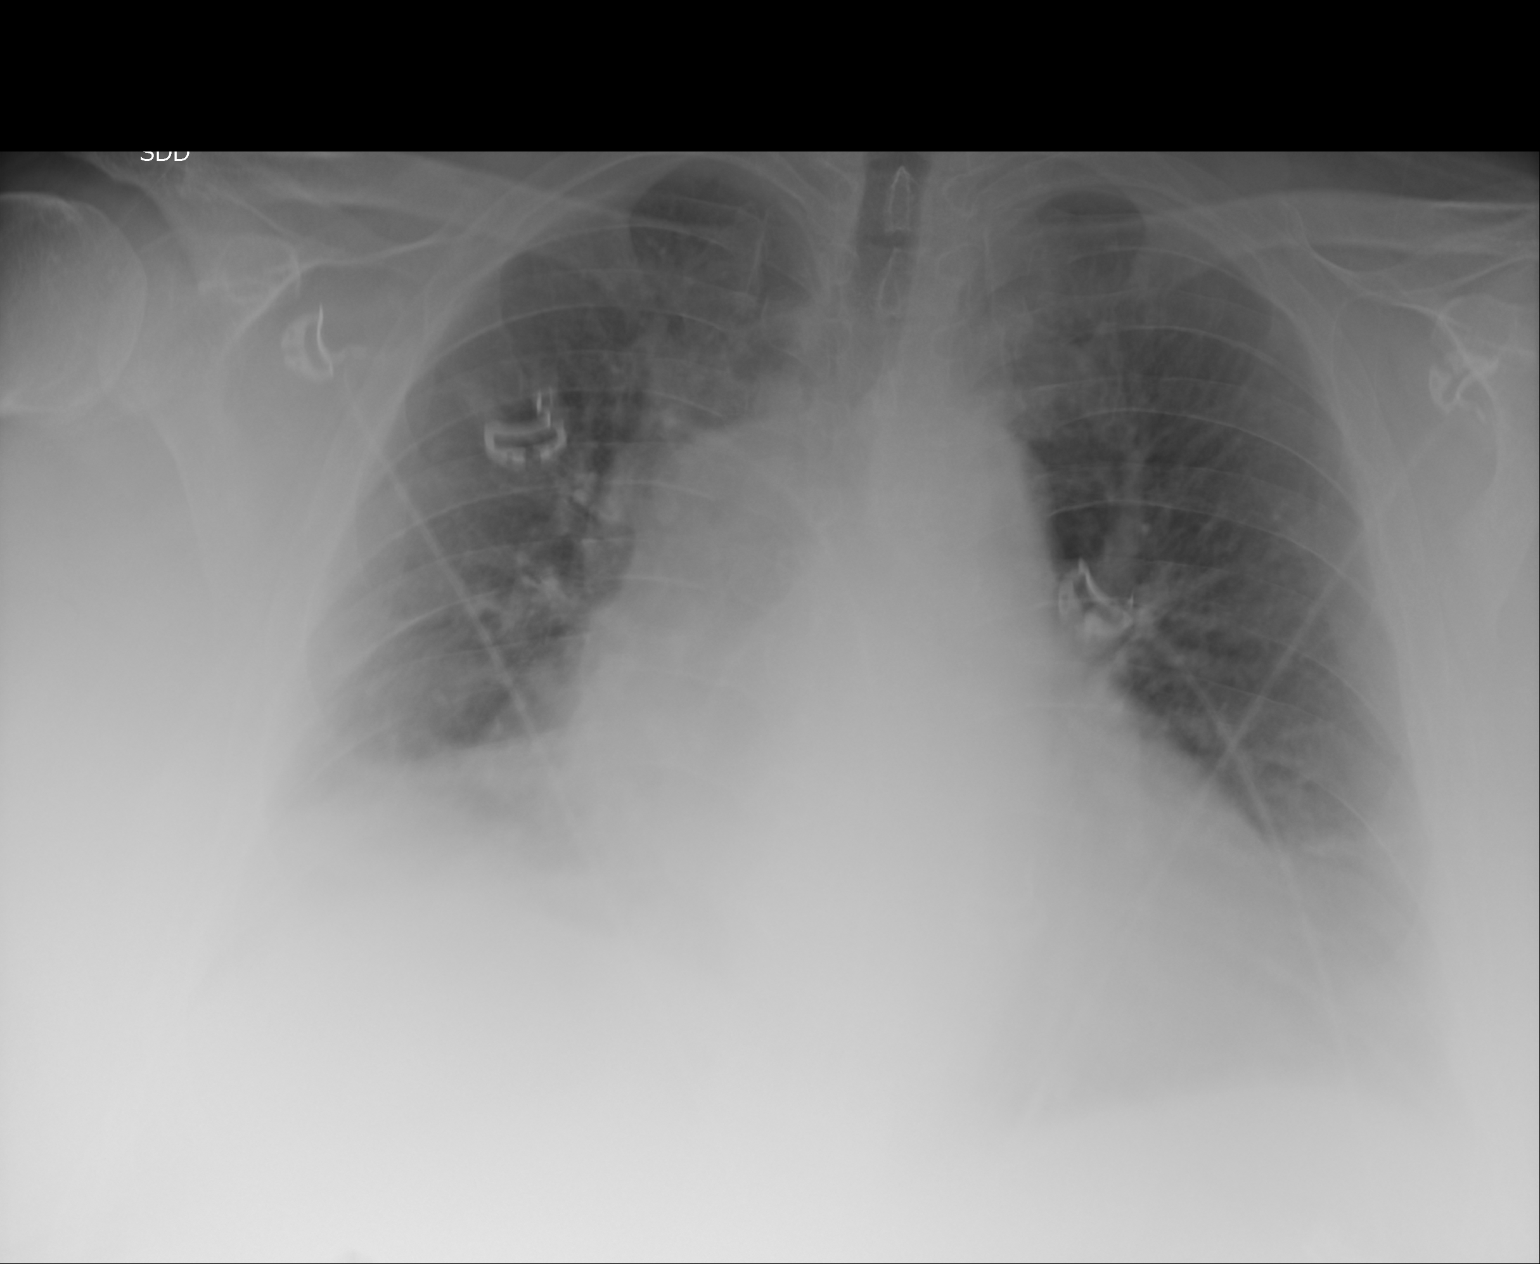
[im 2/2]
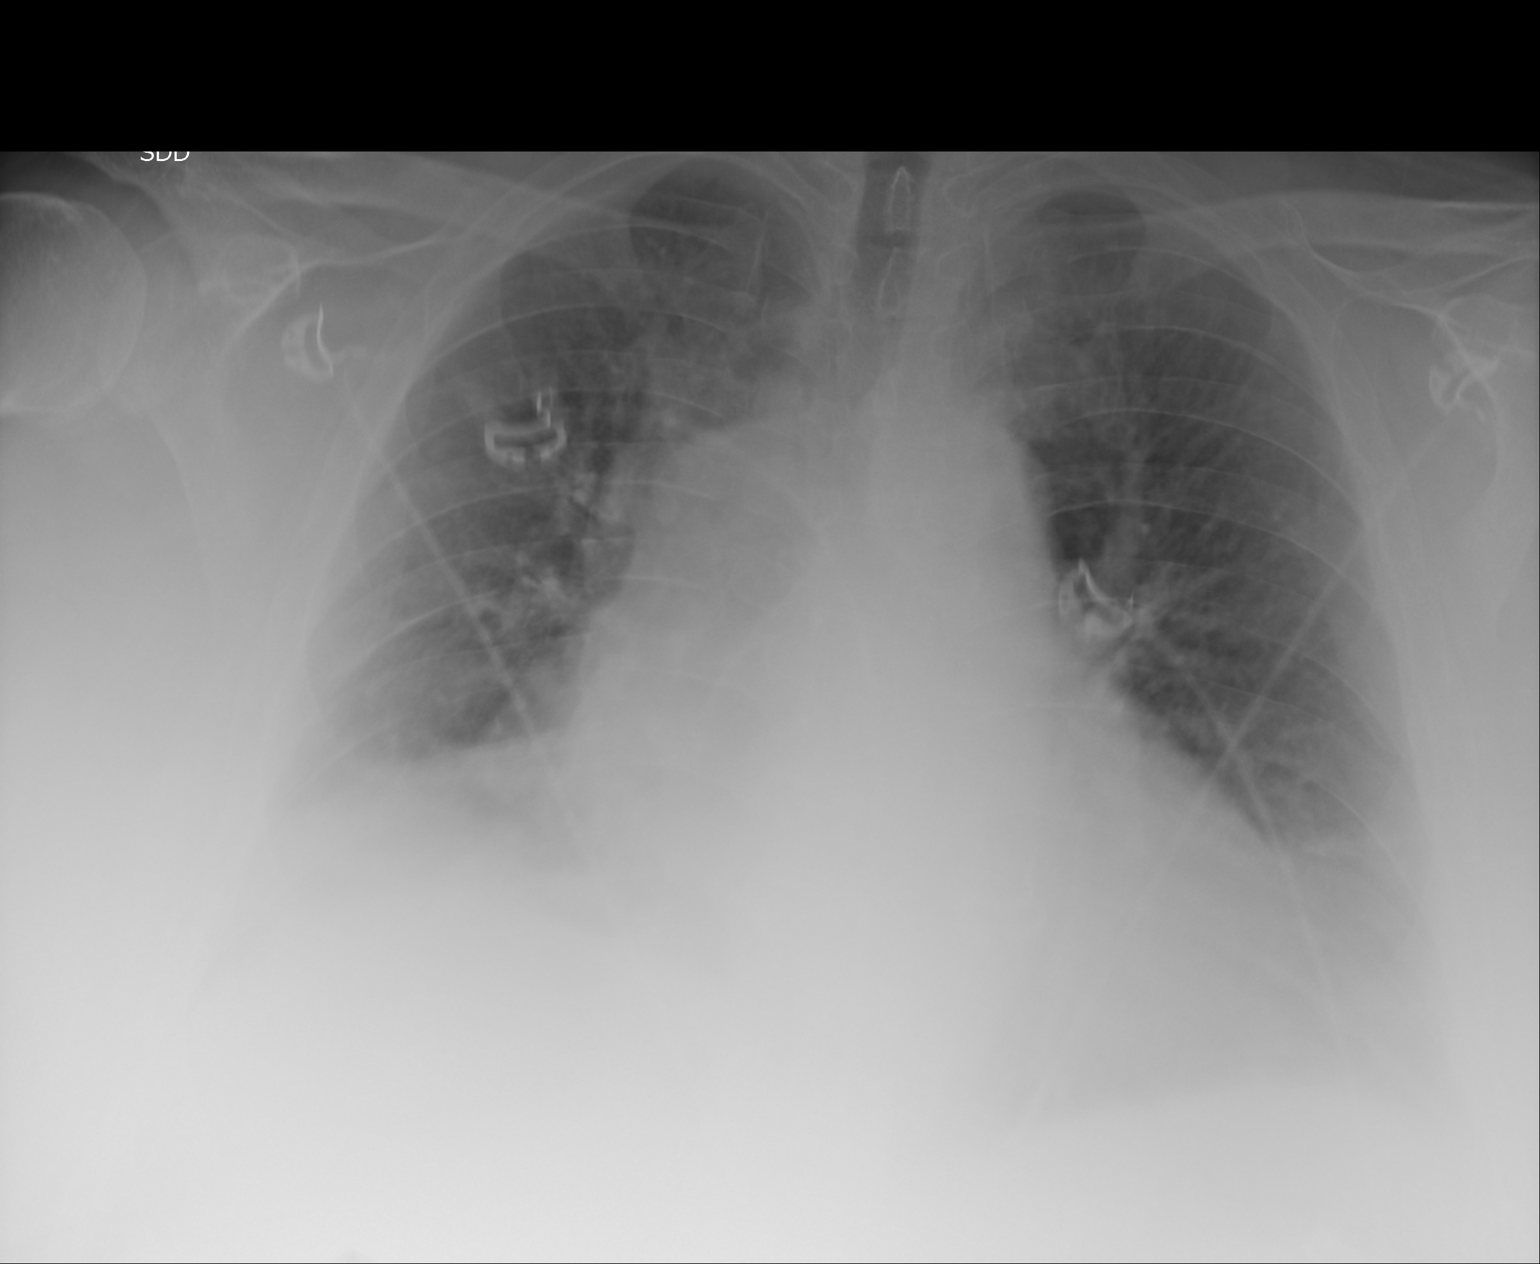

[2 of 2 positions shown; findings below may reference images not displayed]

FINDINGS: Breathing motion blurring. Grossly stable enlarged cardiac
silhouette, pulmonary vascular congestion and minimal prominent
interstitial markings. Interval right basilar airspace opacity.
Stable linear scarring at the left lung base. The visualized bones
are unremarkable.
IMPRESSION: 1. Interval right basilar atelectasis or pneumonia.
2. Stable cardiomegaly, pulmonary vascular congestion and minimal
chronic interstitial lung disease.

## 2020-08-16 IMAGING — CT CT ANGIOGRAPHY CHEST
4 of 10 series · 17 of 46 positions shown · IV contrast (Isovue)
Comparison: None.

CLINICAL DATA: Shortness of breath.

EXAM:
CT ANGIOGRAPHY CHEST
CT ABDOMEN AND PELVIS WITH CONTRAST
TECHNIQUE: Multidetector CT imaging of the chest was performed using the
standard protocol during bolus administration of intravenous
contrast. Multiplanar CT image reconstructions and MIPs were
obtained to evaluate the vascular anatomy. Multidetector CT imaging
of the abdomen and pelvis was performed using the standard protocol
during bolus administration of intravenous contrast.
CONTRAST:  100mL OMNIPAQUE IOHEXOL 350 MG/ML SOLN

[Series 5: axial st · axial · 0.84mm/px · z∈[+1160,+1350]mm · 4 of 107 slices shown (1 of 2)]
[im 22/107  lung]
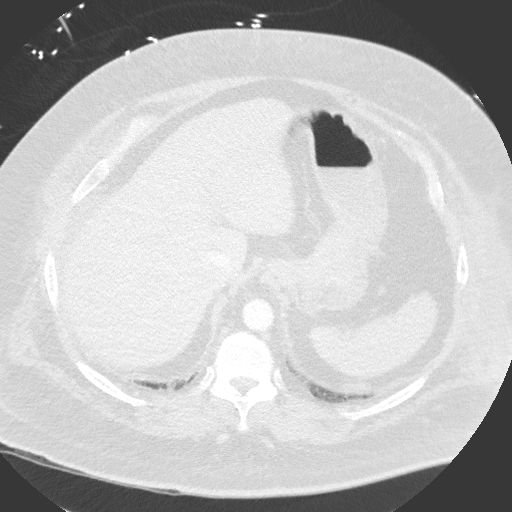
[im 43/107  lung]
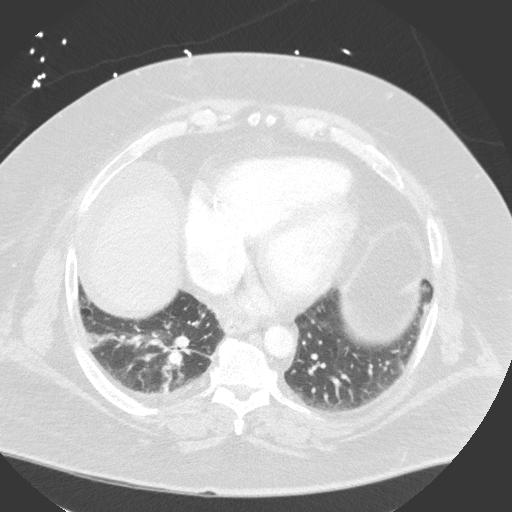
[im 64/107  lung]
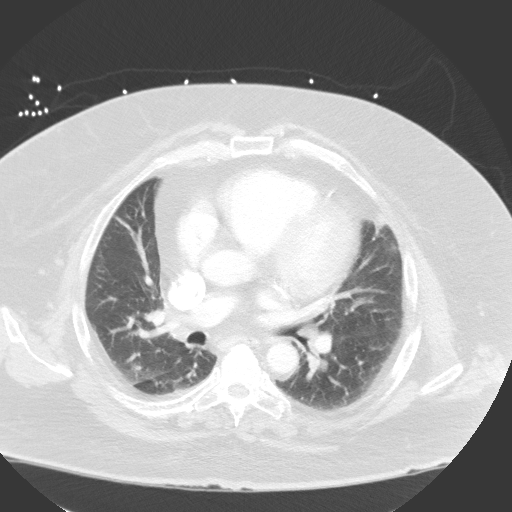
[im 85/107  lung]
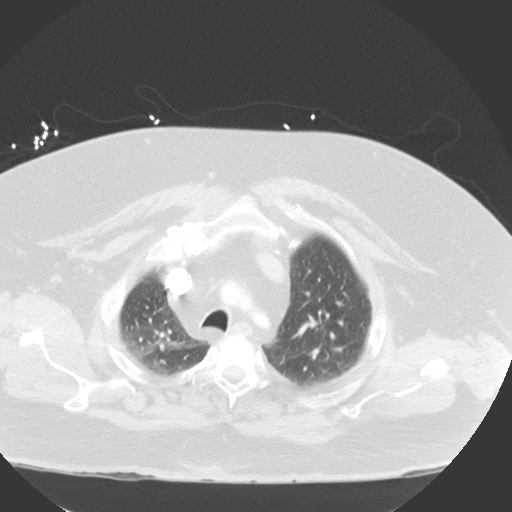

[Series 6: thins · axial · 0.84mm/px · z∈[+1134,+1396]mm · 8 of 320 slices shown]
[im 38/320  lung]
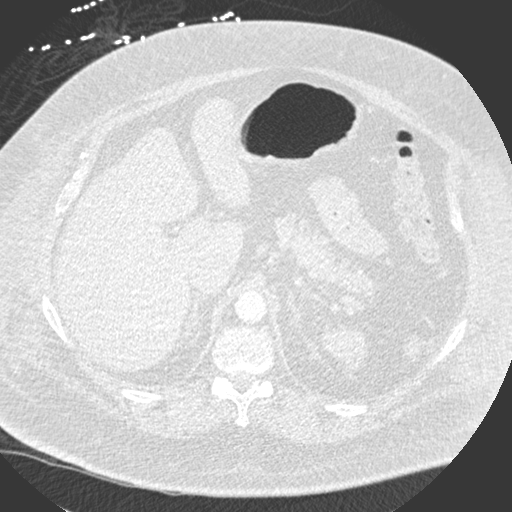
[im 76/320  lung]
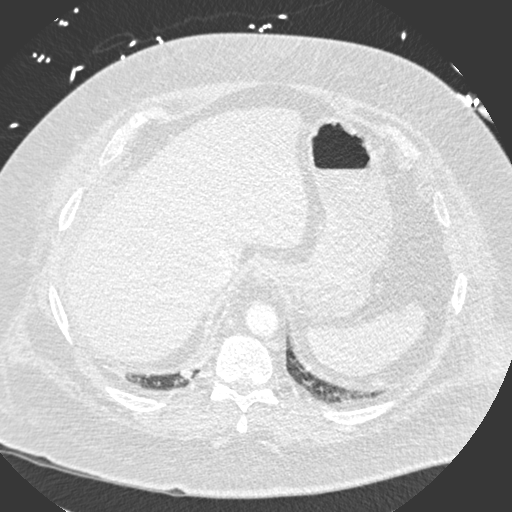
[im 113/320  lung]
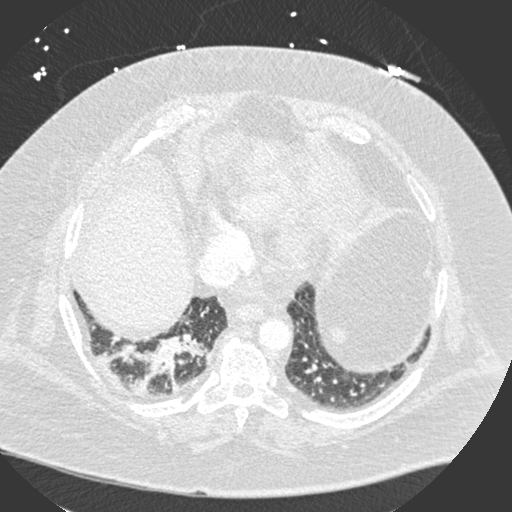
[im 151/320  lung]
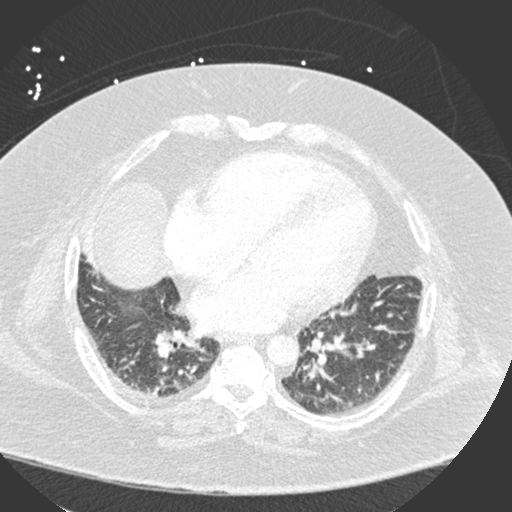
[im 188/320  lung]
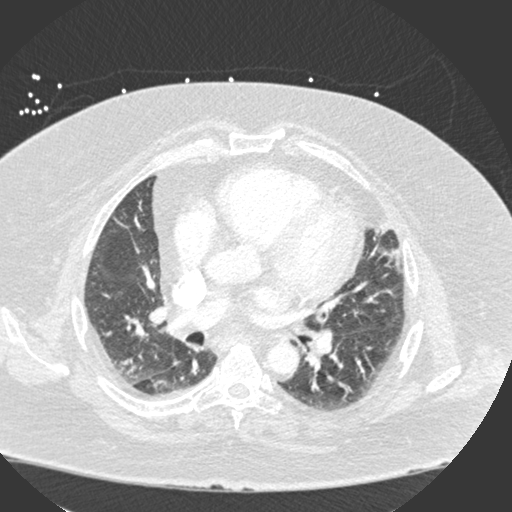
[im 226/320  lung]
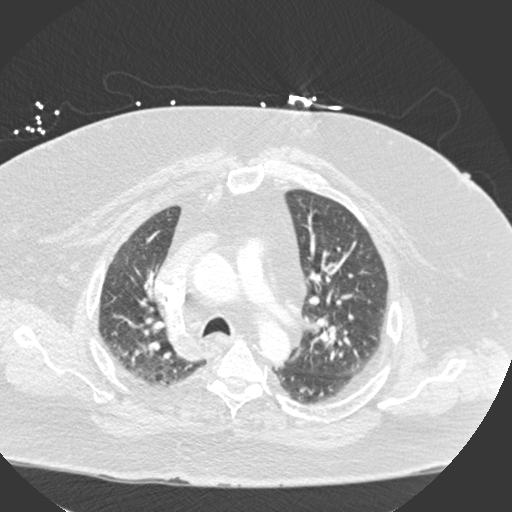
[im 263/320  lung]
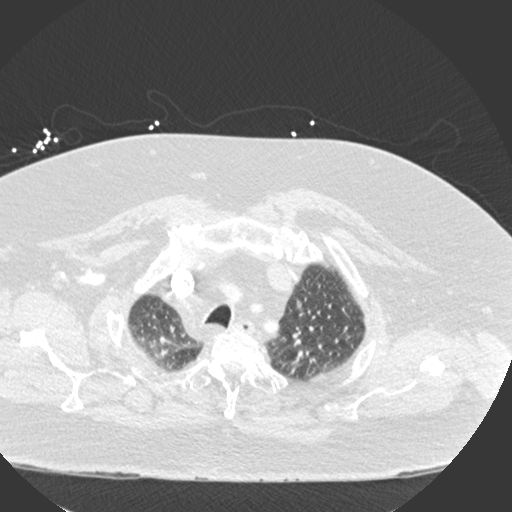
[im 301/320  lung]
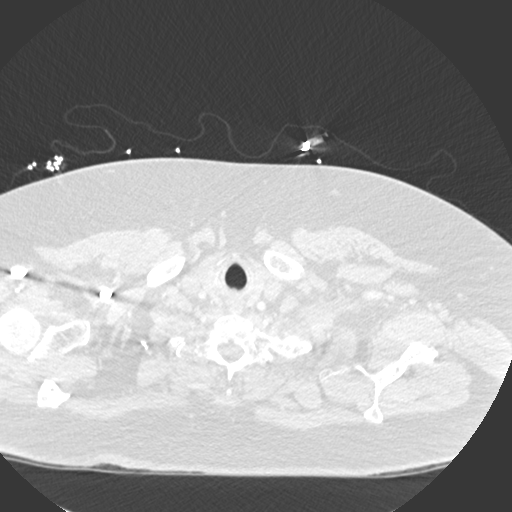

[Series 8: coronal mpr · coronal · 0.64mm/px · 1 of 171 slices shown]
[im 86/171  soft-tissue]
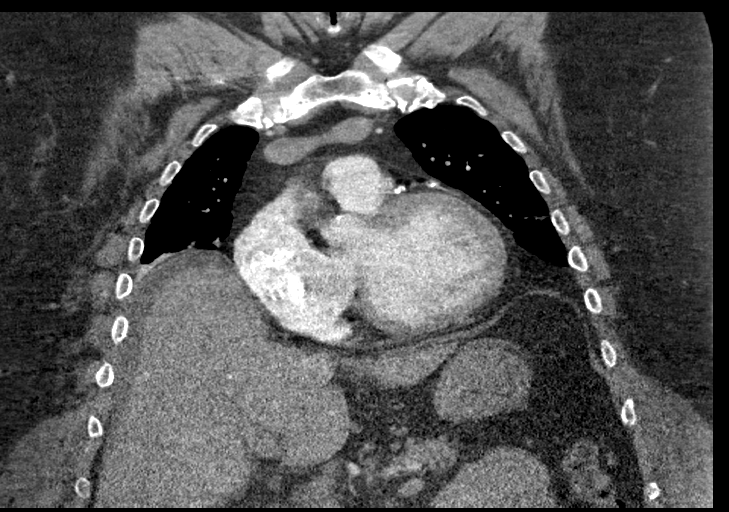

[Series 13: axial st · axial · 1.27mm/px · z∈[+832,+1147]mm · 4 of 107 slices shown (2 of 2)]
[im 22/107  lung]
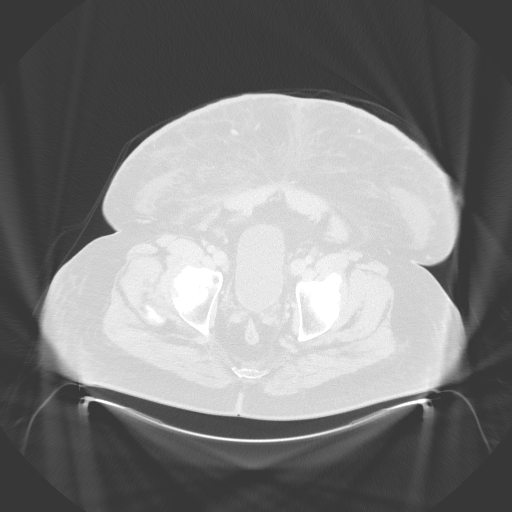
[im 43/107  soft-tissue]
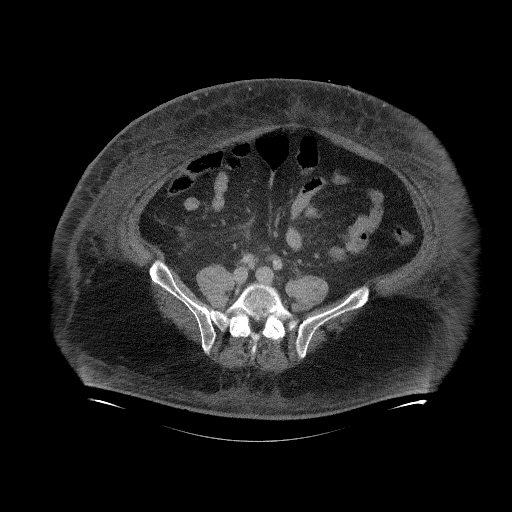
[im 64/107  lung]
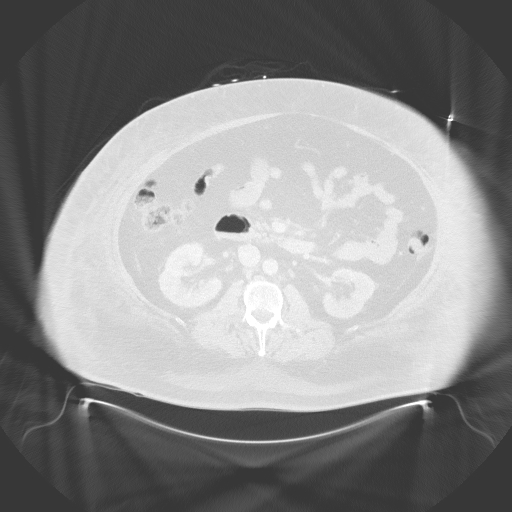
[im 85/107  soft-tissue]
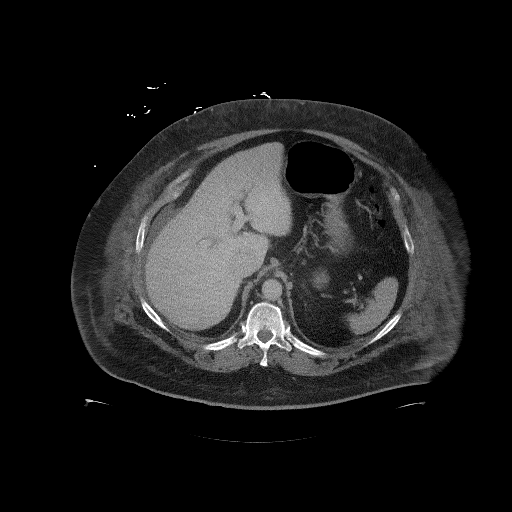

[17 of 46 positions shown; findings below may reference images not displayed]

FINDINGS: CTA CHEST FINDINGS

Cardiovascular: Satisfactory opacification of the pulmonary arteries
to the segmental level. No evidence of pulmonary embolism. Normal
heart size. No pericardial effusion. Age advanced calcific
atherosclerotic disease of the coronary arteries

Mediastinum/Nodes: No enlarged mediastinal, hilar, or axillary lymph
nodes. Thyroid gland, trachea, and esophagus demonstrate no
significant findings.

Lungs/Pleura: Focal airspace consolidation versus atelectasis in the
right lower lobe. Milder atelectatic changes versus airspace
consolidation in the right middle lobe and lingula. No significant
pleural effusion or pneumothorax.

Musculoskeletal: No chest wall abnormality. No acute or significant
osseous findings.

Review of the MIP images confirms the above findings.

CT ABDOMEN and PELVIS FINDINGS

Hepatobiliary: Normal contour and attenuation of the liver. The
gallbladder is decompressed and therefore poorly evaluated. Small
amount of free fluid tracks in the perihepatic space and in the
gallbladder fossa.

Pancreas: Unremarkable. No pancreatic ductal dilatation or
surrounding inflammatory changes.

Spleen: Normal in size without focal abnormality.

Adrenals/Urinary Tract: Adrenal glands are unremarkable. Kidneys are
without focal lesion, or hydronephrosis. 3 mm nonobstructive
calculus in the right renal pelvis. Bladder is unremarkable.

Stomach/Bowel: Stomach is within normal limits. Appendix appears
normal. No evidence of bowel wall thickening, distention, or
inflammatory changes.

Vascular/Lymphatic: Aortic atherosclerosis. No enlarged abdominal or
pelvic lymph nodes.

Reproductive: Prostate is unremarkable.

Other: No abdominal wall hernia or abnormality.

Musculoskeletal: No acute or significant osseous findings. Diffuse
abdominal wall subcutaneous edema.

Review of the MIP images confirms the above findings.
IMPRESSION: 1. No evidence of pulmonary embolus.
2. Age advanced calcific atherosclerotic disease of the coronary
arteries.
3. Focal airspace consolidation versus atelectasis in the right
lower lobe. Milder atelectatic changes versus airspace consolidation
in the right middle lobe and lingula.
4. Small amount of perihepatic ascites, which also tract within the
gallbladder fossa. The gallbladder is poorly evaluated due to its
collapsed state.
5. 3 mm nonobstructive calculus in the right renal pelvis.
6. Diffuse abdominal wall subcutaneous edema.
